# Patient Record
Sex: Male | Born: 1990 | Race: Black or African American | Hispanic: No | Marital: Single | Smoking: Current every day smoker
Health system: Southern US, Community
[De-identification: ages and names within clinical notes are randomized; demographics above are authoritative.]

---
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
## 2009-01-05 ENCOUNTER — Emergency Department (HOSPITAL_COMMUNITY): Admission: EM | Admit: 2009-01-05 | Discharge: 2009-01-05 | Payer: Self-pay | Admitting: Emergency Medicine

## 2009-02-16 ENCOUNTER — Emergency Department (HOSPITAL_COMMUNITY): Admission: EM | Admit: 2009-02-16 | Discharge: 2009-02-16 | Payer: Self-pay | Admitting: Emergency Medicine

## 2009-12-04 ENCOUNTER — Emergency Department (HOSPITAL_COMMUNITY): Admission: EM | Admit: 2009-12-04 | Discharge: 2009-12-04 | Payer: Self-pay | Admitting: Emergency Medicine

## 2010-06-26 LAB — POCT URINALYSIS DIPSTICK
Hgb urine dipstick: NEGATIVE
Nitrite: NEGATIVE
Urobilinogen, UA: 1 mg/dL (ref 0.0–1.0)
pH: 8.5 — ABNORMAL HIGH (ref 5.0–8.0)

## 2010-11-04 ENCOUNTER — Emergency Department (HOSPITAL_COMMUNITY): Payer: Self-pay

## 2010-11-04 ENCOUNTER — Emergency Department (HOSPITAL_COMMUNITY)
Admission: EM | Admit: 2010-11-04 | Discharge: 2010-11-04 | Disposition: A | Discharge status: Home / Self Care | Payer: No Typology Code available for payment source | Attending: Emergency Medicine | Admitting: Emergency Medicine

## 2010-11-04 DIAGNOSIS — M542 Cervicalgia: Secondary | ICD-10-CM | POA: Insufficient documentation

## 2010-11-04 DIAGNOSIS — S139XXA Sprain of joints and ligaments of unspecified parts of neck, initial encounter: Secondary | ICD-10-CM | POA: Insufficient documentation

## 2012-03-20 ENCOUNTER — Other Ambulatory Visit (HOSPITAL_COMMUNITY)
Admission: RE | Admit: 2012-03-20 | Discharge: 2012-03-20 | Disposition: A | Discharge status: Home / Self Care | Payer: 59 | Source: Ambulatory Visit | Attending: Emergency Medicine | Admitting: Emergency Medicine

## 2012-03-20 ENCOUNTER — Emergency Department (INDEPENDENT_AMBULATORY_CARE_PROVIDER_SITE_OTHER)
Admission: EM | Admit: 2012-03-20 | Discharge: 2012-03-20 | Disposition: A | Discharge status: Home / Self Care | Payer: 59 | Source: Home / Self Care | Attending: Emergency Medicine | Admitting: Emergency Medicine

## 2012-03-20 ENCOUNTER — Encounter (HOSPITAL_COMMUNITY): Payer: Self-pay | Admitting: Emergency Medicine

## 2012-03-20 DIAGNOSIS — Z113 Encounter for screening for infections with a predominantly sexual mode of transmission: Secondary | ICD-10-CM | POA: Insufficient documentation

## 2012-03-20 DIAGNOSIS — Z202 Contact with and (suspected) exposure to infections with a predominantly sexual mode of transmission: Secondary | ICD-10-CM

## 2012-03-20 MED ORDER — AZITHROMYCIN 250 MG PO TABS
1000.0000 mg | ORAL_TABLET | Freq: Once | ORAL | Status: AC
  Administered 2012-03-20: 1000 mg via ORAL

## 2012-03-20 MED ORDER — AZITHROMYCIN 250 MG PO TABS
ORAL_TABLET | ORAL | Status: AC
  Filled 2012-03-20: qty 4

## 2012-03-20 NOTE — ED Provider Notes (Signed)
History     CSN: [id number]  Arrival date & time 03/20/12  4098   First MD Initiated Contact with Patient 03/20/12 1010      Chief Complaint  Patient presents with  . Exposure to STD    (Consider location/radiation/quality/duration/timing/severity/associated sxs/prior treatment) HPI Comments: Patient presents urgent care describing that his sexual partner has been diagnosed as having Chlamydia during a pregnancy screening test, he also suspects she was diagnosed with herpes but he's not sure. He comes in today to be treated for Chlamydia and denies any symptoms such as dysuria, urethral discharge, genital rashes or any new symptoms.  Patient is a 21 y.o. male presenting with STD exposure. The history is provided by the patient.  Exposure to STD This is a new problem. The problem has not changed since onset.Pertinent negatives include no abdominal pain. Nothing aggravates the symptoms. He has tried nothing for the symptoms. The treatment provided no relief.    History reviewed. No pertinent past medical history.  History reviewed. No pertinent past surgical history.  No family history on file.  History  Substance Use Topics  . Smoking status: Current Every Day Smoker -- 0.5 packs/day    Types: Cigarettes  . Smokeless tobacco: Not on file  . Alcohol Use: Yes      Review of Systems  Constitutional: Negative for activity change and appetite change.  Gastrointestinal: Negative for abdominal pain.  Genitourinary: Negative for dysuria, urgency, frequency, flank pain, decreased urine volume, discharge, scrotal swelling, enuresis, genital sores, penile pain and testicular pain.  Skin: Negative for rash.    Allergies  Review of patient's allergies indicates no known allergies.  Home Medications  No current outpatient prescriptions on file.  BP 124/74  Pulse 63  Temp 99.2 F (37.3 C) (Oral)  Resp 18  SpO2 99%  Physical Exam  Nursing note and vitals  reviewed. Constitutional: Vital signs are normal. He appears well-developed and well-nourished.  Neck: Neck supple.  Cardiovascular: Normal rate.   No murmur heard. Abdominal: Soft. He exhibits no distension and no mass. There is no tenderness. There is no rebound and no guarding.  Genitourinary: Testes normal and penis normal. No penile erythema. No discharge found.  Neurological: He is alert.  Skin: No rash noted. No erythema.    ED Course  Procedures (including critical care time)   Labs Reviewed  URINE CYTOLOGY ANCILLARY ONLY   No results found.   1. Chlamydia contact, untreated       MDM  Chlamydia exposure. Patient treated today with azithromycin. Sample obtained for echogenic tests for Chlamydia and gonorrhea and trichomonas. Patient was alert about symptoms to be watchful for potential treatment for genital herpes. As patient denies any symptoms currently        Jimmie Molly, MD 03/20/12 1038

## 2012-03-20 NOTE — ED Notes (Signed)
Pt is her for STD check... Exposed by girlfriend... He is asymptomatic... Denies: penile discharge, fevers, vomiting, nauseas, diarrhea... He is alert w/no signs of acute distress.

## 2012-03-24 NOTE — ED Notes (Signed)
Labs reviewed.  GC neg., Chlamydia pos. Pt. adequately treated with Zithromax.  Pt. needs notified and DHHS form needs to be sent. Vassie Moselle 03/24/2012

## 2012-03-24 NOTE — ED Notes (Signed)
Called patient , and after verification of ID, discussed positive chlamydia findings. Treatment while in clinic adequate; form faxed to [facility] records

## 2015-11-03 ENCOUNTER — Emergency Department (HOSPITAL_COMMUNITY)
Admission: EM | Admit: 2015-11-03 | Discharge: 2015-11-03 | Disposition: A | Discharge status: Home / Self Care | Payer: Medicaid Other | Attending: Emergency Medicine | Admitting: Emergency Medicine

## 2015-11-03 ENCOUNTER — Encounter (HOSPITAL_COMMUNITY): Payer: Self-pay | Admitting: Emergency Medicine

## 2015-11-03 DIAGNOSIS — F17210 Nicotine dependence, cigarettes, uncomplicated: Secondary | ICD-10-CM | POA: Insufficient documentation

## 2015-11-03 DIAGNOSIS — K0889 Other specified disorders of teeth and supporting structures: Secondary | ICD-10-CM | POA: Insufficient documentation

## 2015-11-03 MED ORDER — OXYCODONE-ACETAMINOPHEN 5-325 MG PO TABS
1.0000 | ORAL_TABLET | ORAL | Status: DC | PRN
Start: 2015-11-03 — End: 2015-11-03
  Administered 2015-11-03: 1 via ORAL
  Filled 2015-11-03: qty 1

## 2015-11-03 MED ORDER — PENICILLIN V POTASSIUM 500 MG PO TABS: 500.0000 mg | ORAL_TABLET | Freq: Four times a day (QID) | ORAL | 0 refills | Status: AC

## 2015-11-03 MED ORDER — IBUPROFEN 800 MG PO TABS: 800.0000 mg | ORAL_TABLET | Freq: Three times a day (TID) | ORAL | 0 refills | Status: DC | PRN

## 2015-11-03 NOTE — ED Provider Notes (Signed)
[facility] Provider Note   CSN: [id number] Arrival date & time: 11/03/15  4098  First Provider Contact:  First MD Initiated Contact with Patient 11/03/15 [phone]        History   Chief Complaint Chief Complaint  Patient presents with  . Dental Pain    HPI Keith Oneal is a 25 y.o. male.  HPI    Patient presents with dental pain for several months, worse over the past 3 days.  States the pain is on the left side, top and bottom molars.  Has a cracked tooth on top and a hole in the tooth on the bottom.  The pain is sharp and radiates towards his ear.  Has tried orajel and tylenol without relief, also tried 1 leftover antibiotic pill.  Denies fevers, facial swelling, sore throat, difficulty swallowing or breathing.     History reviewed. No pertinent past medical history.  There are no active problems to display for this patient.   History reviewed. No pertinent surgical history.  OB History    No data available       Home Medications    Prior to Admission medications   Not on File    Family History No family history on file.  Social History Social History  Substance Use Topics  . Smoking status: Current Every Day Smoker    Packs/day: 0.00    Types: Cigarettes  . Smokeless tobacco: Not on file  . Alcohol use Yes     Allergies   Review of patient's allergies indicates no known allergies.   Review of Systems Review of Systems  Constitutional: Negative for activity change, appetite change, chills and fever.  HENT: Positive for dental problem. Negative for drooling, facial swelling, sinus pressure, sore throat and trouble swallowing.   Respiratory: Negative for shortness of breath.   Musculoskeletal: Negative for myalgias, neck pain and neck stiffness.  Skin: Negative for color change and rash.  Allergic/Immunologic: Negative for immunocompromised state.  Psychiatric/Behavioral: Negative for self-injury.     Physical Exam Updated Vital  Signs BP 164/64 (BP Location: Left Arm)   Pulse 70   Temp 98.6 F (37 C)   Resp 16   Ht  (1.803 m)   Wt 70.3 kg   SpO2 99%   BMI 21.62 kg/m   Physical Exam  Constitutional: He appears well-developed and well-nourished. No distress.  HENT:  Head: Normocephalic and atraumatic.  Mouth/Throat: Uvula is midline and oropharynx is clear and moist. Mucous membranes are not dry. No uvula swelling. No oropharyngeal exudate, posterior oropharyngeal edema, posterior oropharyngeal erythema or tonsillar abscesses.  Left lower third molar tender to percussion.  Left upper molar with remote fracture, nontender.  No facial swelling.   Neck: Normal range of motion. Neck supple.  Cardiovascular: Normal rate.   Pulmonary/Chest: Effort normal and breath sounds normal. No stridor.  Lymphadenopathy:    He has no cervical adenopathy.  Neurological: He is alert.  Skin: He is not diaphoretic.  Nursing note and vitals reviewed.    ED Treatments / Results  Labs (all labs ordered are listed, but only abnormal results are displayed) Labs Reviewed - No data to display  EKG  EKG Interpretation None       Radiology No results found.  Procedures Procedures (including critical care time)  Medications Ordered in ED Medications - No data to display   Initial Impression / Assessment and Plan / ED Course  I have reviewed the triage vital signs and the nursing  notes.  Pertinent labs & imaging results that were available during my care of the patient were reviewed by me and considered in my medical decision making (see chart for details).  Clinical Course    Afebrile, nontoxic patient with new dental pain.  No obvious abscess.  No concerning findings on exam.  Doubt deep space head or neck infection.  Doubt Ludwig's angina.  D/C home with antibiotic, pain medication and dental follow up.  Discussed findings, treatment, and follow up  with patient.  Pt given return precautions.  Pt verbalizes  understanding and agrees with plan.      Final Clinical Impressions(s) / ED Diagnoses   Final diagnoses:  Pain, dental    New Prescriptions Discharge Medication List as of 11/03/2015  6:44 AM    START taking these medications   Details  ibuprofen (ADVIL,MOTRIN) 800 MG tablet Take 1 tablet (800 mg total) by mouth every 8 (eight) hours as needed for mild pain or moderate pain., Starting Mon 11/03/2015, Print    penicillin v potassium (VEETID) 500 MG tablet Take 1 tablet (500 mg total) by mouth 4 (four) times daily. X 7 days, Starting Mon 11/03/2015, Until Mon 11/10/2015, Print         [place], [place] 11/03/15 1249    Shon Baton, MD 11/03/15 2302

## 2015-11-03 NOTE — Discharge Instructions (Signed)
Read the information below.  Use the prescribed medication as directed.  Please discuss all new medications with your pharmacist.  You may return to the Emergency Department at any time for worsening condition or any new symptoms that concern you.   Please call the dentist listed above within 48 hours to schedule a close follow up appointment.  If you develop fevers, swelling in your face, difficulty swallowing or breathing, return to the ER immediately for a recheck.   °

## 2015-11-03 NOTE — ED Triage Notes (Signed)
Patient reports worsening left upper /lower molar pain for several days .

## 2018-08-23 ENCOUNTER — Encounter (HOSPITAL_COMMUNITY): Payer: Self-pay | Admitting: Emergency Medicine

## 2018-08-23 ENCOUNTER — Ambulatory Visit (HOSPITAL_COMMUNITY)
Admission: EM | Admit: 2018-08-23 | Discharge: 2018-08-23 | Disposition: A | Discharge status: Home / Self Care | Payer: Self-pay | Attending: Family Medicine | Admitting: Family Medicine

## 2018-08-23 ENCOUNTER — Other Ambulatory Visit: Payer: Self-pay

## 2018-08-23 DIAGNOSIS — Z202 Contact with and (suspected) exposure to infections with a predominantly sexual mode of transmission: Secondary | ICD-10-CM

## 2018-08-23 DIAGNOSIS — R590 Localized enlarged lymph nodes: Secondary | ICD-10-CM

## 2018-08-23 MED ORDER — CEFTRIAXONE SODIUM 250 MG IJ SOLR
INTRAMUSCULAR | Status: AC
  Filled 2018-08-23: qty 250

## 2018-08-23 MED ORDER — AZITHROMYCIN 250 MG PO TABS
1000.0000 mg | ORAL_TABLET | Freq: Once | ORAL | Status: AC
  Administered 2018-08-23: 14:00:00 1000 mg via ORAL

## 2018-08-23 MED ORDER — AZITHROMYCIN 250 MG PO TABS
ORAL_TABLET | ORAL | Status: AC
  Filled 2018-08-23: qty 4

## 2018-08-23 MED ORDER — CEFTRIAXONE SODIUM 250 MG IJ SOLR
250.0000 mg | Freq: Once | INTRAMUSCULAR | Status: AC
  Administered 2018-08-23: 14:00:00 250 mg via INTRAMUSCULAR

## 2018-08-23 NOTE — Discharge Instructions (Signed)
You have been given the following medications today for treatment of chlamydia:  cefTRIAXone (ROCEPHIN) injection 250 mg azithromycin (ZITHROMAX) tablet 1,000 mg  Please refrain from all sexual activity for at least the next seven days.

## 2018-08-23 NOTE — ED Triage Notes (Signed)
Pt here for right sided neck lymph nodes swelling x 2 days

## 2018-08-23 NOTE — ED Provider Notes (Signed)
[facility]   [id number] 08/23/18 Arrival Time: 1316  ASSESSMENT & PLAN:  1. STD exposure   2. Lymphadenopathy, cervical    Observation of R lymphadenopathy. If not resolving over the next week, recommend serial evaluations. Discussed.   Discharge Instructions     You have Oneal given the following medications today for treatment of chlamydia:  cefTRIAXone (ROCEPHIN) injection 250 mg azithromycin (ZITHROMAX) tablet 1,000 mg  Please refrain from all sexual activity for at least the next seven days.    Declines urine cytology/HIV/RPR.  Reviewed expectations re: course of current medical issues. Questions answered. Outlined signs and symptoms indicating need for more acute intervention. Patient verbalized understanding. After Visit Summary given.   SUBJECTIVE:  Keith Oneal is a 28 y.o. male who reports possible chlamydia exposure. Sexual partner told him of + chlamydia test recently. He reports no penile discharge or rashes. Does report noticing a "painful lump" of R lower neck; noticed yesterday. No fever reported. Urinary symptoms: none. Afebrile. No abdominal or pelvic pain. No n/v. No rashes or lesions. Sexually active with multiple male partners without regular condom use; oral and penile-vaginal sex.  ROS: As per HPI.  OBJECTIVE:  Vitals:   08/23/18 1337  BP: 130/89  Pulse: 89  Resp: 18  Temp: 99 F (37.2 C)  TempSrc: Oral  SpO2: 95%    General appearance: alert, cooperative, appears stated age and no distress Scalp: no skin lesions/wounds/signs of infection Throat: lips, mucosa, and tongue normal; teeth and gums normal Neck: R solitary enlarged cervical lymph node; no fluctuance; tender to touch; approx 1 cm CV: RRR Lungs: CTAB Back: no CVA tenderness; FROM at waist Abdomen: soft, non-tender GU: deferred Skin: warm and dry Psychological: alert and cooperative; normal mood and affect.   No Known Allergies  PMH: Treated chlamydia.   Social History   Socioeconomic History  . Marital status: Married    Spouse name: Not on file  . Number of children: Not on file  . Years of education: Not on file  . Highest education level: Not on file  Occupational History  . Not on file  Social Needs  . Financial resource strain: Not on file  . Food insecurity:    Worry: Not on file    Inability: Not on file  . Transportation needs:    Medical: Not on file    Non-medical: Not on file  Tobacco Use  . Smoking status: Current Every Day Smoker    Packs/day: 0.00    Types: Cigarettes  Substance and Sexual Activity  . Alcohol use: Yes  . Drug use: No  . Sexual activity: Not on file  Lifestyle  . Physical activity:    Days per week: Not on file    Minutes per session: Not on file  . Stress: Not on file  Relationships  . Social connections:    Talks on phone: Not on file    Gets together: Not on file    Attends religious service: Not on file    Active member of club or organization: Not on file    Attends meetings of clubs or organizations: Not on file    Relationship status: Not on file  . Intimate partner violence:    Fear of current or ex partner: Not on file    Emotionally abused: Not on file    Physically abused: Not on file    Forced sexual activity: Not on file  Other Topics Concern  . Not on file  Social History Narrative  . Not on file          Mardella Layman, MD 08/24/18 [phone]

## 2018-09-16 ENCOUNTER — Other Ambulatory Visit: Payer: Self-pay

## 2018-09-16 ENCOUNTER — Emergency Department (HOSPITAL_COMMUNITY): Payer: Self-pay

## 2018-09-16 ENCOUNTER — Emergency Department (HOSPITAL_COMMUNITY)
Admission: EM | Admit: 2018-09-16 | Discharge: 2018-09-16 | Disposition: A | Discharge status: Home / Self Care | Payer: Self-pay | Attending: Emergency Medicine | Admitting: Emergency Medicine

## 2018-09-16 ENCOUNTER — Encounter (HOSPITAL_COMMUNITY): Payer: Self-pay | Admitting: Emergency Medicine

## 2018-09-16 DIAGNOSIS — R112 Nausea with vomiting, unspecified: Secondary | ICD-10-CM | POA: Insufficient documentation

## 2018-09-16 DIAGNOSIS — R1030 Lower abdominal pain, unspecified: Secondary | ICD-10-CM | POA: Insufficient documentation

## 2018-09-16 DIAGNOSIS — R3129 Other microscopic hematuria: Secondary | ICD-10-CM | POA: Insufficient documentation

## 2018-09-16 DIAGNOSIS — F17210 Nicotine dependence, cigarettes, uncomplicated: Secondary | ICD-10-CM | POA: Insufficient documentation

## 2018-09-16 DIAGNOSIS — R61 Generalized hyperhidrosis: Secondary | ICD-10-CM | POA: Insufficient documentation

## 2018-09-16 DIAGNOSIS — D72829 Elevated white blood cell count, unspecified: Secondary | ICD-10-CM | POA: Insufficient documentation

## 2018-09-16 LAB — RAPID URINE DRUG SCREEN, HOSP PERFORMED
Amphetamines: NOT DETECTED
Barbiturates: NOT DETECTED
Benzodiazepines: NOT DETECTED
Cocaine: NOT DETECTED
Opiates: POSITIVE — AB
Tetrahydrocannabinol: POSITIVE — AB

## 2018-09-16 LAB — CBC WITH DIFFERENTIAL/PLATELET
Abs Immature Granulocytes: 0.05 10*3/uL (ref 0.00–0.07)
Basophils Absolute: 0.1 10*3/uL (ref 0.0–0.1)
Basophils Relative: 0 %
Eosinophils Absolute: 0.1 10*3/uL (ref 0.0–0.5)
Eosinophils Relative: 1 %
HCT: 43.8 % (ref 39.0–52.0)
Hemoglobin: 15.1 g/dL (ref 13.0–17.0)
Immature Granulocytes: 0 %
Lymphocytes Relative: 24 %
Lymphs Abs: 3.9 10*3/uL (ref 0.7–4.0)
MCH: 32.3 pg (ref 26.0–34.0)
MCHC: 34.5 g/dL (ref 30.0–36.0)
MCV: 93.6 fL (ref 80.0–100.0)
Monocytes Absolute: 0.9 10*3/uL (ref 0.1–1.0)
Monocytes Relative: 5 %
Neutro Abs: 11.4 10*3/uL — ABNORMAL HIGH (ref 1.7–7.7)
Neutrophils Relative %: 70 %
Platelets: 247 10*3/uL (ref 150–400)
RBC: 4.68 MIL/uL (ref 4.22–5.81)
RDW: 12.2 % (ref 11.5–15.5)
WBC: 16.4 10*3/uL — ABNORMAL HIGH (ref 4.0–10.5)
nRBC: 0 % (ref 0.0–0.2)

## 2018-09-16 LAB — COMPREHENSIVE METABOLIC PANEL
ALT: 24 U/L (ref 0–44)
AST: 31 U/L (ref 15–41)
Albumin: 4.4 g/dL (ref 3.5–5.0)
Alkaline Phosphatase: 78 U/L (ref 38–126)
Anion gap: 9 (ref 5–15)
BUN: 12 mg/dL (ref 6–20)
CO2: 25 mmol/L (ref 22–32)
Calcium: 9.2 mg/dL (ref 8.9–10.3)
Chloride: 103 mmol/L (ref 98–111)
Creatinine, Ser: 1.26 mg/dL — ABNORMAL HIGH (ref 0.61–1.24)
GFR calc Af Amer: >60 mL/min (ref >60)
GFR calc non Af Amer: >60 mL/min (ref >60)
Glucose, Bld: 119 mg/dL — ABNORMAL HIGH (ref 70–99)
Potassium: 3.8 mmol/L (ref 3.5–5.1)
Sodium: 137 mmol/L (ref 135–145)
Total Bilirubin: 1.1 mg/dL (ref 0.3–1.2)
Total Protein: 7.6 g/dL (ref 6.5–8.1)

## 2018-09-16 LAB — URINALYSIS, ROUTINE W REFLEX MICROSCOPIC
Bilirubin Urine: NEGATIVE
Glucose, UA: NEGATIVE mg/dL
Ketones, ur: NEGATIVE mg/dL
Leukocytes,Ua: NEGATIVE
Nitrite: NEGATIVE
Protein, ur: NEGATIVE mg/dL
RBC / HPF: >50 RBC/hpf — ABNORMAL HIGH (ref 0–5)
Specific Gravity, Urine: 1.018 (ref 1.005–1.030)
pH: 7 (ref 5.0–8.0)

## 2018-09-16 LAB — ETHANOL: Alcohol, Ethyl (B): <10 mg/dL (ref <10)

## 2018-09-16 LAB — LIPASE, BLOOD: Lipase: 32 U/L (ref 11–51)

## 2018-09-16 MED ORDER — ONDANSETRON HCL 4 MG/2ML IJ SOLN
4.0000 mg | Freq: Once | INTRAMUSCULAR | Status: AC
  Administered 2018-09-16: 4 mg via INTRAVENOUS
  Filled 2018-09-16: qty 2

## 2018-09-16 MED ORDER — DICYCLOMINE HCL 10 MG/ML IM SOLN: 20.0000 mg | Freq: Once | INTRAMUSCULAR | Status: DC

## 2018-09-16 MED ORDER — IOHEXOL 300 MG/ML  SOLN
100.0000 mL | Freq: Once | INTRAMUSCULAR | Status: AC | PRN
  Administered 2018-09-16: 100 mL via INTRAVENOUS

## 2018-09-16 MED ORDER — HYDROMORPHONE HCL 1 MG/ML IJ SOLN
1.0000 mg | Freq: Once | INTRAMUSCULAR | Status: AC
  Administered 2018-09-16: 1 mg via INTRAVENOUS
  Filled 2018-09-16: qty 1

## 2018-09-16 MED ORDER — SODIUM CHLORIDE 0.9 % IV BOLUS (SEPSIS)
1000.0000 mL | Freq: Once | INTRAVENOUS | Status: AC
  Administered 2018-09-16: 1000 mL via INTRAVENOUS

## 2018-09-16 MED ORDER — ONDANSETRON 4 MG PO TBDP: 4.0000 mg | ORAL_TABLET | Freq: Four times a day (QID) | ORAL | 0 refills | Status: DC | PRN

## 2018-09-16 MED ORDER — SODIUM CHLORIDE (PF) 0.9 % IJ SOLN
INTRAMUSCULAR | Status: AC
  Filled 2018-09-16: qty 50

## 2018-09-16 MED ORDER — SODIUM CHLORIDE 0.9 % IV SOLN
1.0000 g | Freq: Once | INTRAVENOUS | Status: AC
  Administered 2018-09-16: 07:00:00 1 g via INTRAVENOUS
  Filled 2018-09-16: qty 10

## 2018-09-16 MED ORDER — CEPHALEXIN 500 MG PO CAPS: 500.0000 mg | ORAL_CAPSULE | Freq: Three times a day (TID) | ORAL | 0 refills | Status: AC

## 2018-09-16 MED ORDER — DICYCLOMINE HCL 20 MG PO TABS: 20.0000 mg | ORAL_TABLET | Freq: Three times a day (TID) | ORAL | 0 refills | Status: AC | PRN

## 2018-09-16 NOTE — ED Triage Notes (Signed)
Patient BIB [facility] from [facility] for abdominal pain that woke him from sleep about 2 hours ago. Pt admits to using drugs and alcohol tonight. C/o nausea and vomiting, pain 10/10.

## 2018-09-16 NOTE — Discharge Instructions (Signed)
Steps to find a Primary Care Provider (PCP): ° °Call [phone] or [phone] to access "[facility] Find a Doctor Service." ° °2.  You may also go on the [facility] website at [email or web address] ° °3.  [facility] also frequently accepts new patients. ° °[facility]  °[address] °[place] [address] °[phone] ° °4.  There are also multiple [facility], [facility], [facility] and [facility] practices throughout the [place] that are frequently accepting new patients. You may find a clinic that is close to your home and contact them. ° °[facility] °[email or web address] °[phone] ° °[facility] °[email or web address] ° °[facility] °[email or web address] °[phone] ° °[facility] °[email or web address] °[phone] ° °5.  Local Health Departments also can provide primary care services. ° °[facility]  °[address] °[place] [address] °[phone] ° °[facility] °[address] °[place] [address] °[phone] ° °[facility] °[address]  °[place] [address] °[phone] ° ° °

## 2018-09-16 NOTE — ED Notes (Signed)
Po challenge of water provided for pt. He is tolerating well at present.

## 2018-09-16 NOTE — ED Notes (Signed)
Bed: QA44 Expected date:  Expected time:  Means of arrival:  Comments: 66M abd pain

## 2018-09-16 NOTE — ED Notes (Signed)
Patient is aware urine sample is needed, but states he is unable to at this time.

## 2018-09-16 NOTE — ED Provider Notes (Addendum)
TIME SEEN: 3:53 AM  CHIEF COMPLAINT: Abdominal pain, vomiting  HPI: Patient is a 28 year old male with no significant past medical history who presents to the emergency department with abdominal pain that started about 2 hours ago that is severe, sharp throughout the lower abdomen worse in the right lower quadrant with nausea and vomiting.  Has never had similar symptoms.  Denies any fevers, chills.  He is diaphoretic here.  Denies diarrhea.  No dysuria, hematuria, testicular pain or swelling, penile discharge.  No previous history of abdominal surgery.  No history of kidney stones.  No sick contacts.  No chest pain, shortness of breath, cough.  ROS: See HPI Constitutional: no fever  Eyes: no drainage  ENT: no runny nose   Cardiovascular:  no chest pain  Resp: no SOB  GI:  vomiting GU: no dysuria Integumentary: no rash  Allergy: no hives  Musculoskeletal: no leg swelling  Neurological: no slurred speech ROS otherwise negative  PAST MEDICAL HISTORY/PAST SURGICAL HISTORY:  None  MEDICATIONS:  Prior to Admission medications   Medication Sig Start Date End Date Taking? Authorizing Provider  ibuprofen (ADVIL,MOTRIN) 800 MG tablet Take 1 tablet (800 mg total) by mouth every 8 (eight) hours as needed for mild pain or moderate pain. 11/03/15   Trixie DredgeWest, Emily, PA-C    ALLERGIES:  No Known Allergies  SOCIAL HISTORY:  Social History   Tobacco Use  . Smoking status: Current Every Day Smoker    Packs/day: 0.00    Types: Cigarettes  Substance Use Topics  . Alcohol use: Yes    FAMILY HISTORY: No family history on file.  EXAM: BP (!) 142/102 (BP Location: Left Arm)   Pulse 74   Temp 97.7 F (36.5 C) (Oral)   Resp 16   Ht 5\' 9"  (1.753 m)   Wt 77.1 kg   SpO2 97%   BMI 25.10 kg/m  CONSTITUTIONAL: Alert and oriented and responds appropriately to questions.  Patient appears very uncomfortable, diaphoretic, afebrile HEAD: Normocephalic EYES: Conjunctivae clear, pupils appear equal,  EOMI ENT: normal nose; moist mucous membranes NECK: Supple, no meningismus, no nuchal rigidity, no LAD  CARD: RRR; S1 and S2 appreciated; no murmurs, no clicks, no rubs, no gallops RESP: Normal chest excursion without splinting or tachypnea; breath sounds clear and equal bilaterally; no wheezes, no rhonchi, no rales, no hypoxia or respiratory distress, speaking full sentences ABD/GI: Normal bowel sounds; non-distended; soft, tender throughout the lower abdomen with guarding at McBurney's point, no rebound tenderness GU:  Normal external genitalia, circumcised male, normal penile shaft, no blood or discharge at the urethral meatus, no testicular masses or tenderness on exam, no scrotal masses or swelling, no hernias appreciated, 2+ femoral pulses bilaterally; no perineal erythema, warmth, subcutaneous air or crepitus; no high riding testicle, normal bilateral cremasteric reflex.  Chaperone present for exam. BACK:  The back appears normal and is non-tender to palpation, there is no CVA tenderness EXT: Normal ROM in all joints; non-tender to palpation; no edema; normal capillary refill; no cyanosis, no calf tenderness or swelling    SKIN: Normal color for age and race; warm; no rash NEURO: Moves all extremities equally PSYCH: The patient's mood and manner are appropriate. Grooming and personal hygiene are appropriate.  MEDICAL DECISION MAKING: Patient here with right lower quadrant abdominal pain.  Differential includes appendicitis, kidney stone, UTI, colitis, diverticulitis, bowel obstruction.  Will obtain labs, urine, CT of the abdomen pelvis.  Will give IV fluids, pain and nausea medicine.  ED PROGRESS: Patient  admitted to drug and alcohol use with nursing staff.  Will obtain ethanol level, UDS.   4:50 AM  Patient appears more comfortable.  He does have a leukocytosis of 16,000 with left shift.  His ethanol level is negative.  He reports he drank "a lot" of alcohol today and took 1-1/2 Percocets.   States he took them by mouth.  Denies any other drug use.  States these are medications that he bought off the street.  He does not have a prescription for Percocet.  6:10 AM  Pt still complaining of lower abdominal pain and has right lower quadrant and left lower quadrant tenderness with voluntary guarding.  A CT scan is pending.  His labs show a leukocytosis.  Mildly elevated creatinine of 1.26 with normal BUN.  Otherwise unremarkable.  Urine has been sent.  Will give second dose of IV Dilaudid here for pain control.   6:29 AM  CT of the abdomen pelvis shows no acute abnormality.  Appendix appears normal.  No inflammation of the bowel or sign of bowel obstruction.  Bladder, kidneys appear normal.  Gallbladder, pancreas appear normal.  Patient denies having any diarrhea.  He denies any genital symptoms and refuses genital exam.  He is comfortable and resting.  Will give IM Bentyl here and then fluid challenge.  6:45 AM  Pt's urine shows a large amount of blood but no other sign of infection.  We will send urine culture and obtain urine gonorrhea and chlamydia screening.  I have talked to the radiologist Dr. Chase PicketHerman who has reviewed his imaging again and states that there are no kidney stones, signs of hydronephrosis or hydroureter and appendix appears normal.  It does appear that the patient was seen at urgent care on May 13 for possible STD exposure as his partner was positive for chlamydia.  He did receive ceftriaxone and azithromycin.  He refused STD testing at that time.  He told that physician that he was sexually active with multiple male partners and did not use condoms.  7:02 AM  Pt now allows genital exam which is unremarkable.  He declines HIV and syphilis testing today.  He states he is feeling much better and able to drink without difficulty.  He states he thinks this could have been a bad food exposure and from drinking alcohol.  He states he was sexually active last night.  He has not had  any penile discharge.  States he has not seen any gross hematuria.  He is not having dysuria, testicular pain or swelling.  Discussed with patient that he does have blood in his urine and has a leukocytosis with left shift which could be from infection versus reactive in nature.  Urine culture will be sent and IV Rocephin ordered today.  Will discharge on Keflex with prescriptions of Zofran and Bentyl.  I do not feel he needs to be discharged with narcotic pain medication given his history of opiate abuse.  Discussed return precautions with patient and his mother by phone.  They verbalized understanding and are appreciative of care.   At this time, I do not feel there is any life-threatening condition present. I have reviewed and discussed all results (EKG, imaging, lab, urine as appropriate) and exam findings with patient/family. I have reviewed nursing notes and appropriate previous records.  I feel the patient is safe to be discharged home without further emergent workup and can continue workup as an outpatient as needed. Discussed usual and customary return precautions. Patient/family verbalize  understanding and are comfortable with this plan.  Outpatient follow-up has been provided as needed. All questions have been answered.        Ward, Delice Bison, DO 09/16/18 [phone]

## 2018-09-17 LAB — URINE CULTURE: Culture: NO GROWTH

## 2019-01-01 ENCOUNTER — Ambulatory Visit (HOSPITAL_COMMUNITY)
Admission: EM | Admit: 2019-01-01 | Discharge: 2019-01-01 | Disposition: A | Discharge status: Home / Self Care | Payer: Self-pay | Attending: Emergency Medicine | Admitting: Emergency Medicine

## 2019-01-01 ENCOUNTER — Encounter (HOSPITAL_COMMUNITY): Payer: Self-pay | Admitting: Emergency Medicine

## 2019-01-01 ENCOUNTER — Other Ambulatory Visit: Payer: Self-pay

## 2019-01-01 DIAGNOSIS — L0291 Cutaneous abscess, unspecified: Secondary | ICD-10-CM

## 2019-01-01 DIAGNOSIS — L02213 Cutaneous abscess of chest wall: Secondary | ICD-10-CM

## 2019-01-01 MED ORDER — IBUPROFEN 800 MG PO TABS: 800.0000 mg | ORAL_TABLET | Freq: Three times a day (TID) | ORAL | 0 refills | Status: AC | PRN

## 2019-01-01 MED ORDER — LIDOCAINE HCL (PF) 1 % IJ SOLN
INTRAMUSCULAR | Status: AC
  Filled 2019-01-01: qty 30

## 2019-01-01 MED ORDER — IBUPROFEN 800 MG PO TABS: 800.0000 mg | ORAL_TABLET | Freq: Three times a day (TID) | ORAL | 0 refills | Status: DC | PRN

## 2019-01-01 MED ORDER — SULFAMETHOXAZOLE-TRIMETHOPRIM 800-160 MG PO TABS: 1.0000 | ORAL_TABLET | Freq: Two times a day (BID) | ORAL | 0 refills | Status: AC

## 2019-01-01 NOTE — Discharge Instructions (Addendum)
Take the antibiotic as prescribed.  You can take the prescribed ibuprofen as directed for pain.    Keep your wound clean and dry.  Wash it gently twice a day with soap and water.  Apply an antibiotic cream and bandage twice a day.    Return here if you see signs of infection, such as increased pain, redness, warmth, fever, chills, or other concerning symptoms.    Your blood pressure is elevated today at 161/96.  Please have this rechecked by your primary care provider in 2 weeks.  If you do not have a primary care provider, one is suggested below.

## 2019-01-01 NOTE — ED Triage Notes (Signed)
Pt here for abscess to right side of chest with some bloody drainage noted; pt sts pain x 4 days

## 2019-01-01 NOTE — ED Provider Notes (Signed)
[facility]    CSN: [id number] Arrival date & time: 01/01/19  1803      History   Chief Complaint Chief Complaint  Patient presents with  . Abscess    HPI Keith Oneal is a 28 y.o. male.   Patient presents with an abscess on his right chest x4 days.  He reports it is draining purulent bloody discharge.  He states it started as an ingrown hair.  He denies fever or chills.  Patient states he is taking antibiotics given to him by his grandmother but does not know the name.  The history is provided by the patient.    History reviewed. No pertinent past medical history.  There are no active problems to display for this patient.   History reviewed. No pertinent surgical history.     Home Medications    Prior to Admission medications   Medication Sig Start Date End Date Taking? Authorizing Provider  cephALEXin (KEFLEX) 500 MG capsule Take 1 capsule (500 mg total) by mouth 3 (three) times daily. Patient not taking: Reported on 01/01/2019 09/16/18   Ward, Layla Maw, DO  dicyclomine (BENTYL) 20 MG tablet Take 1 tablet (20 mg total) by mouth every 8 (eight) hours as needed for spasms (Abdominal cramping). 09/16/18   Ward, Layla Maw, DO  ibuprofen (ADVIL) 800 MG tablet Take 1 tablet (800 mg total) by mouth every 8 (eight) hours as needed for mild pain or moderate pain. 01/01/19   Mickie Bail, NP  ondansetron (ZOFRAN ODT) 4 MG disintegrating tablet Take 1 tablet (4 mg total) by mouth every 6 (six) hours as needed for nausea or vomiting. 09/16/18   Ward, Layla Maw, DO  sulfamethoxazole-trimethoprim (BACTRIM DS) 800-160 MG tablet Take 1 tablet by mouth 2 (two) times daily for 10 days. 01/01/19 01/11/19  Mickie Bail, NP    Family History History reviewed. No pertinent family history.  Social History Social History   Tobacco Use  . Smoking status: Current Every Day Smoker    Packs/day: 0.00    Types: Cigarettes  . Smokeless tobacco: Never Used  Substance Use Topics  .  Alcohol use: Yes  . Drug use: No     Allergies   Patient has no known allergies.   Review of Systems Review of Systems  Constitutional: Negative for chills and fever.  HENT: Negative for ear pain and sore throat.   Eyes: Negative for pain and visual disturbance.  Respiratory: Negative for cough and shortness of breath.   Cardiovascular: Negative for chest pain and palpitations.  Gastrointestinal: Negative for abdominal pain and vomiting.  Genitourinary: Negative for dysuria and hematuria.  Musculoskeletal: Negative for arthralgias and back pain.  Skin: Positive for wound. Negative for color change and rash.  Neurological: Negative for seizures and syncope.  All other systems reviewed and are negative.    Physical Exam Triage Vital Signs ED Triage Vitals  Enc Vitals Group     BP 01/01/19 1909 (!) 161/96     Pulse Rate 01/01/19 1909 82     Resp 01/01/19 1909 16     Temp 01/01/19 1909 98.2 F (36.8 C)     Temp Source 01/01/19 1909 Temporal     SpO2 01/01/19 1909 99 %     Weight --      Height --      Head Circumference --      Peak Flow --      Pain Score 01/01/19 1923 10  Pain Loc --      Pain Edu? --      Excl. in GC? --    No data found.  Updated Vital Signs BP (!) 161/96 (BP Location: Left Arm)   Pulse 82   Temp 98.2 F (36.8 C) (Temporal)   Resp 16   SpO2 99%   Visual Acuity Right Eye Distance:   Left Eye Distance:   Bilateral Distance:    Right Eye Near:   Left Eye Near:    Bilateral Near:     Physical Exam Vitals signs and nursing note reviewed.  Constitutional:      Appearance: He is well-developed.  HENT:     Head: Normocephalic and atraumatic.     Mouth/Throat:     Mouth: Mucous membranes are moist.  Eyes:     Conjunctiva/sclera: Conjunctivae normal.  Neck:     Musculoskeletal: Neck supple.  Cardiovascular:     Rate and Rhythm: Normal rate and regular rhythm.     Heart sounds: No murmur.  Pulmonary:     Effort: Pulmonary effort  is normal. No respiratory distress.     Breath sounds: Normal breath sounds.  Abdominal:     Palpations: Abdomen is soft.     Tenderness: There is no abdominal tenderness. There is no guarding or rebound.  Skin:    General: Skin is warm and dry.     Capillary Refill: Capillary refill takes less than 2 seconds.     Findings: Lesion present.     Comments: 4 cm area of induration with central open lesion.  Scant purulent, bloody drainage.    Neurological:     General: No focal deficit present.     Mental Status: He is alert and oriented to person, place, and time.     Sensory: No sensory deficit.     Motor: No weakness.      UC Treatments / Results  Labs (all labs ordered are listed, but only abnormal results are displayed) Labs Reviewed - No data to display  EKG   Radiology No results found.  Procedures Incision and Drainage  Date/Time: 01/01/2019 7:48 PM Performed by: Mickie Bailate, Michio Thier H, NP Authorized by: Mickie Bailate, Ashantia Amaral H, NP   Consent:    Consent obtained:  Verbal   Consent given by:  Patient Location:    Type:  Abscess   Location:  Trunk   Trunk location:  Chest Pre-procedure details:    Skin preparation:  Hibiclens Procedure details:    Incision depth:  Dermal   Scalpel blade:  11   Drainage amount:  Scant   Wound treatment:  Wound left open Post-procedure details:    Patient tolerance of procedure:  Tolerated well, no immediate complications   (including critical care time)  Medications Ordered in UC Medications  lidocaine (PF) (XYLOCAINE) 1 % injection (has no administration in time range)    Initial Impression / Assessment and Plan / UC Course  I have reviewed the triage vital signs and the nursing notes.  Pertinent labs & imaging results that were available during my care of the patient were reviewed by me and considered in my medical decision making (see chart for details).    Abscess on right chest wall.  Incision and drainage with scant discharge.   Treating with Septra DS and ibuprofen.  Wound care instructions and signs of infection discussed with patient at length.  Discussed with patient that his blood pressure is elevated today needs to be rechecked by his PCP  or the PCP suggested in 2 to 4 weeks.  Patient agrees to plan of care.   Final Clinical Impressions(s) / UC Diagnoses   Final diagnoses:  Abscess     Discharge Instructions     Take the antibiotic as prescribed.  You can take the prescribed ibuprofen as directed for pain.    Keep your wound clean and dry.  Wash it gently twice a day with soap and water.  Apply an antibiotic cream and bandage twice a day.    Return here if you see signs of infection, such as increased pain, redness, warmth, fever, chills, or other concerning symptoms.    Your blood pressure is elevated today at 161/96.  Please have this rechecked by your primary care provider in 2 weeks.  If you do not have a primary care provider, one is suggested below.          ED Prescriptions    Medication Sig Dispense Auth. Provider   ibuprofen (ADVIL) 800 MG tablet  (Status: Discontinued) Take 1 tablet (800 mg total) by mouth every 8 (eight) hours as needed for mild pain or moderate pain. 15 tablet Sharion Balloon, NP   sulfamethoxazole-trimethoprim (BACTRIM DS) 800-160 MG tablet Take 1 tablet by mouth 2 (two) times daily for 10 days. 20 tablet Sharion Balloon, NP   ibuprofen (ADVIL) 800 MG tablet Take 1 tablet (800 mg total) by mouth every 8 (eight) hours as needed for mild pain or moderate pain. 15 tablet Sharion Balloon, NP     PDMP not reviewed this encounter.   Sharion Balloon, NP 01/01/19 [phone]

## 2019-11-27 IMAGING — CT CT ABDOMEN AND PELVIS WITH CONTRAST
2 of 4 series · 16 of 46 positions shown, 18 images · IV contrast (ISOVUE)
Comparison: None.

CLINICAL DATA: Right lower quadrant abdominal pain

EXAM:
CT ABDOMEN AND PELVIS WITH CONTRAST
TECHNIQUE: Multidetector CT imaging of the abdomen and pelvis was performed
using the standard protocol following bolus administration of
intravenous contrast.
CONTRAST:  100mL OMNIPAQUE IOHEXOL 300 MG/ML  SOLN

[Series 2: axial st · axial · 0.71mm/px · z∈[-229,+181]mm · 13 of 92 slices shown, 15 images]
[im 5/92  soft-tissue]
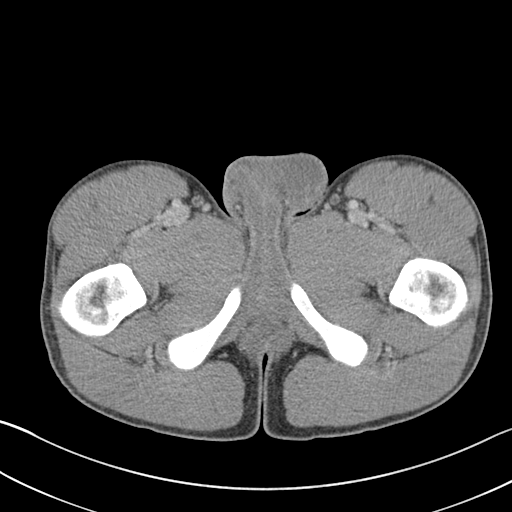
[im 5/92  bone]
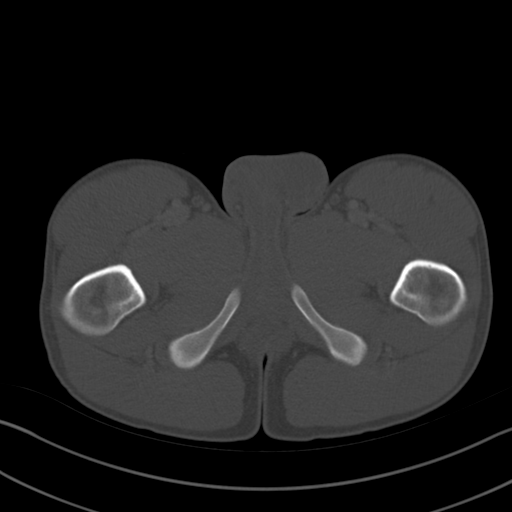
[im 14/92  soft-tissue]
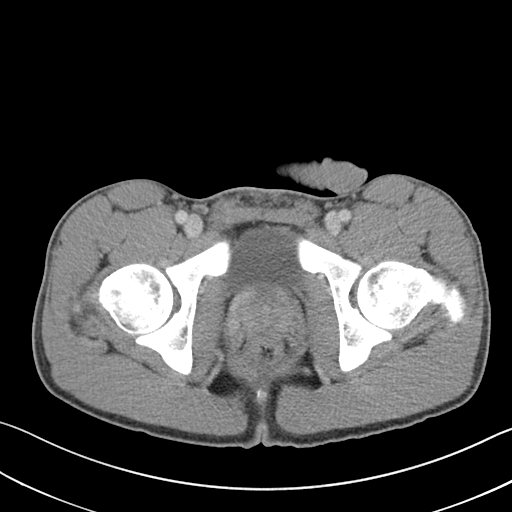
[im 18/92  soft-tissue]
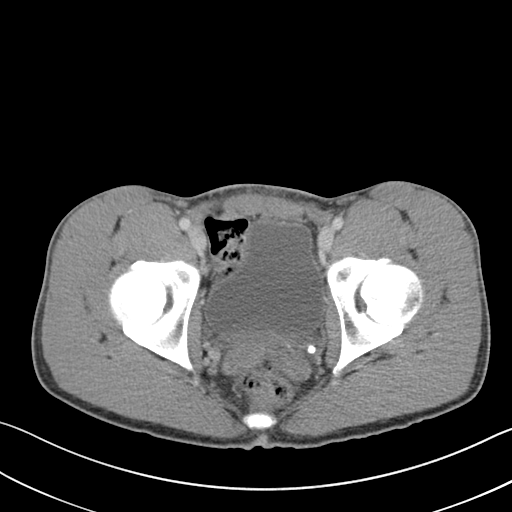
[im 27/92  soft-tissue]
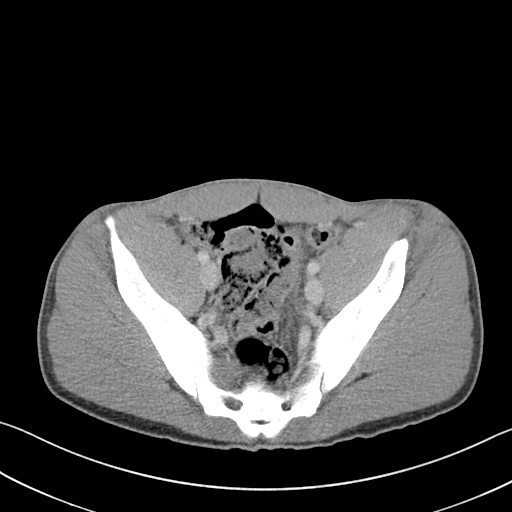
[im 31/92  soft-tissue]
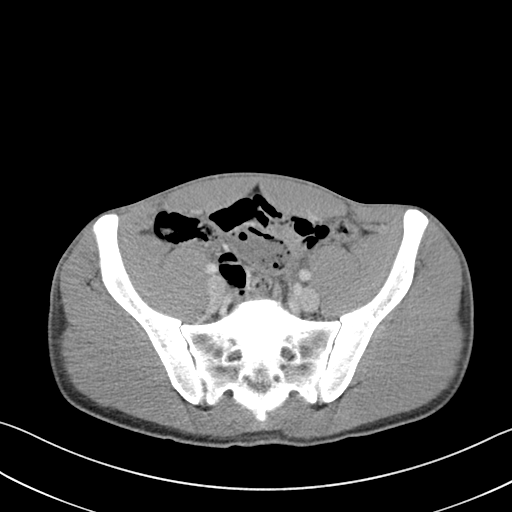
[im 40/92  soft-tissue]
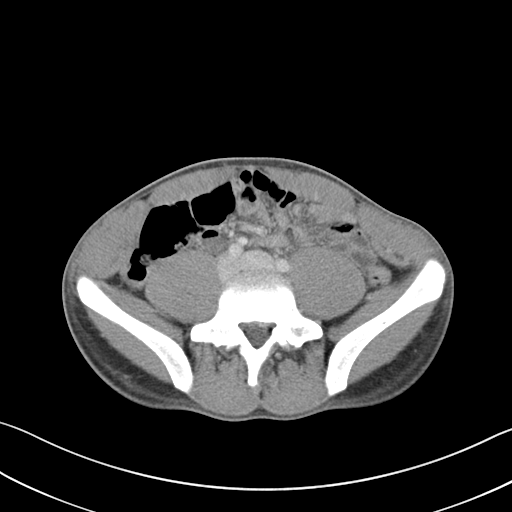
[im 48/92  soft-tissue]
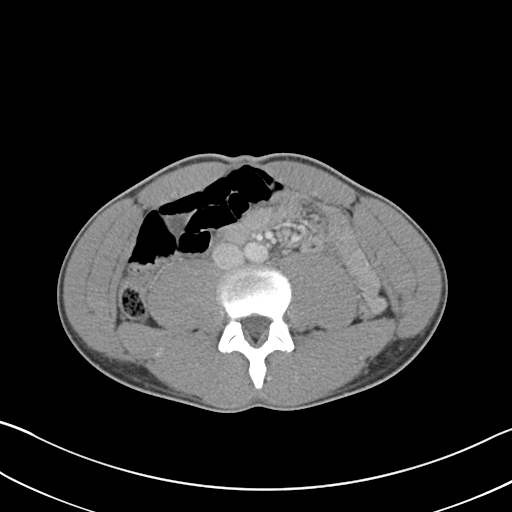
[im 53/92  soft-tissue]
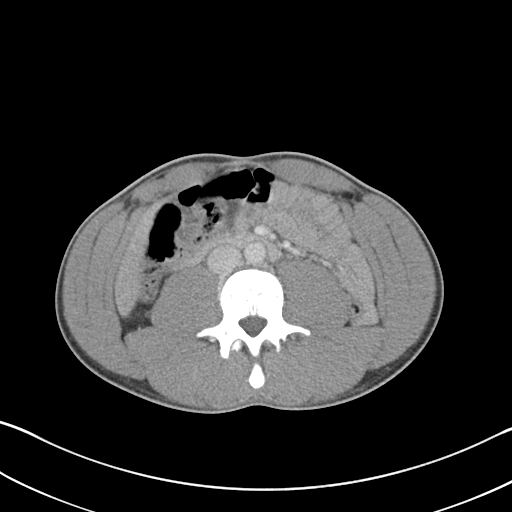
[im 61/92  soft-tissue]
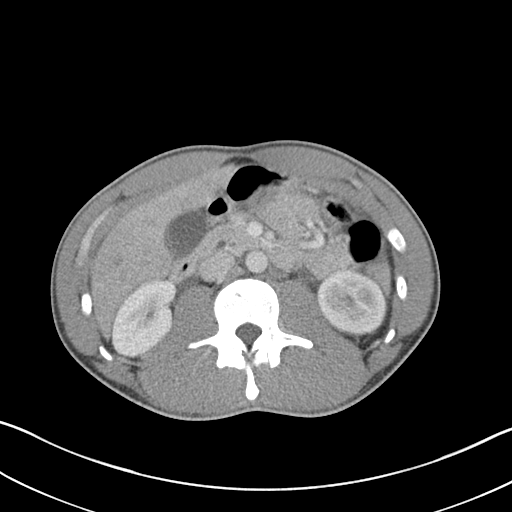
[im 61/92  bone]
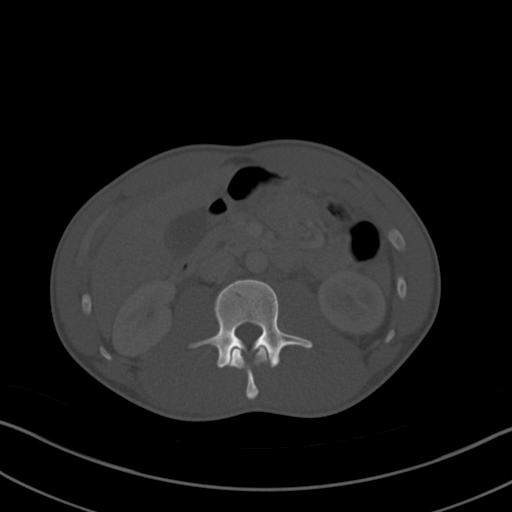
[im 66/92  soft-tissue]
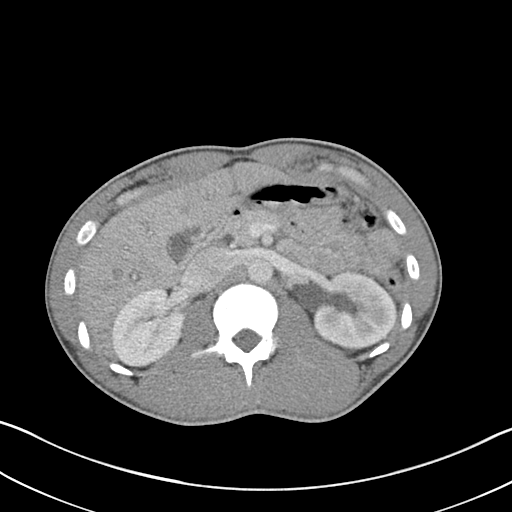
[im 74/92  soft-tissue]
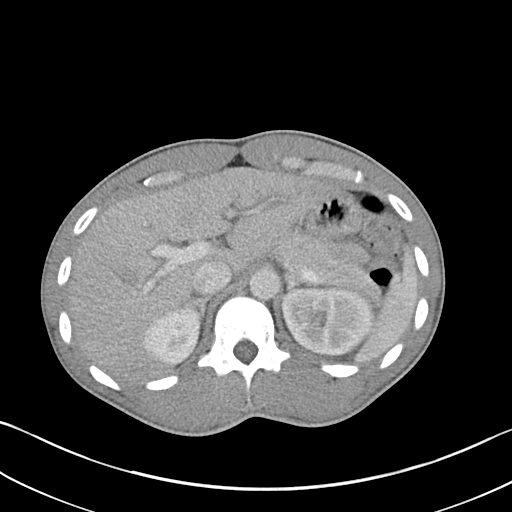
[im 79/92  soft-tissue]
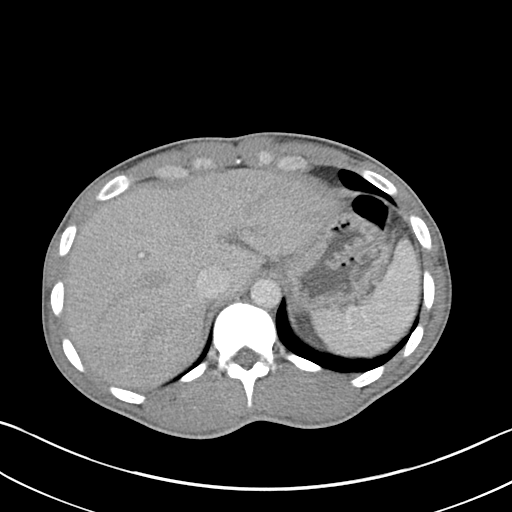
[im 87/92  soft-tissue]
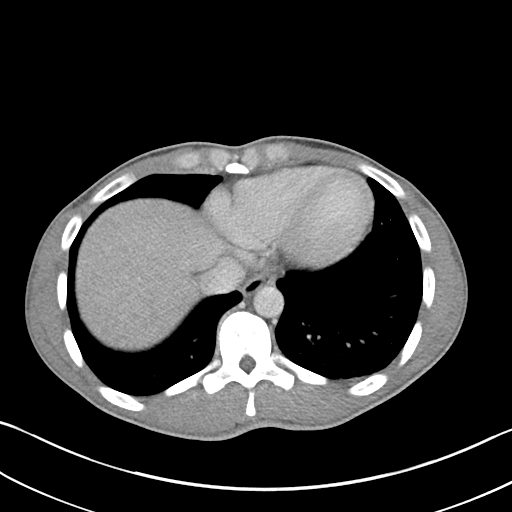

[Series 5: coronal st · coronal · 0.67mm/px · 3 of 119 slices shown]
[im 40/119  soft-tissue]
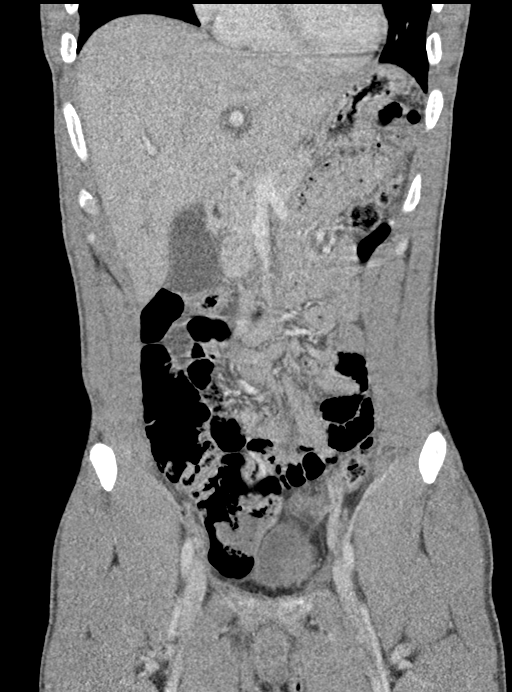
[im 53/119  soft-tissue]
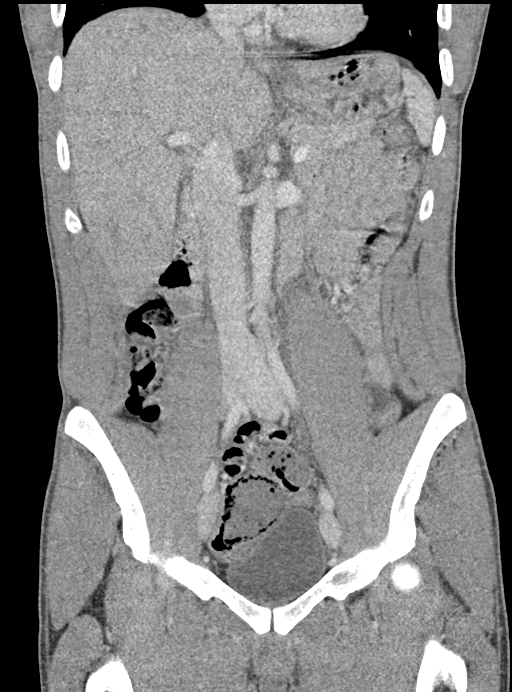
[im 66/119  soft-tissue]
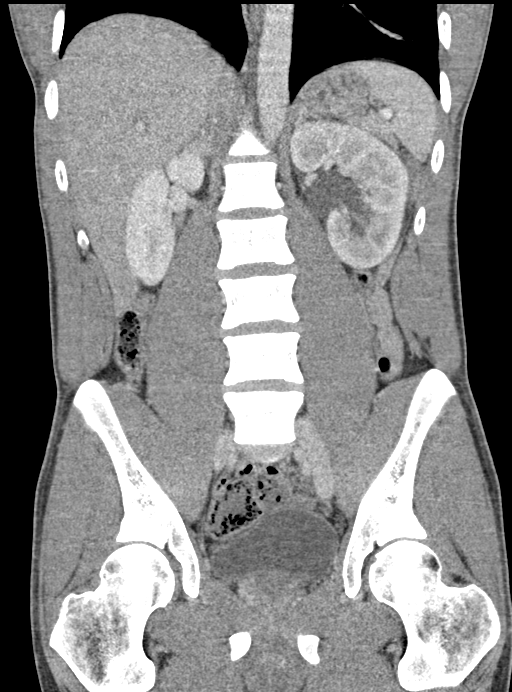

[16 of 46 positions shown; findings below may reference images not displayed]

FINDINGS: LOWER CHEST: There is no basilar pleural or apical pericardial
effusion.

HEPATOBILIARY: The hepatic contours and density are normal. There is
no intra- or extrahepatic biliary dilatation. The gallbladder is
normal.

PANCREAS: The pancreatic parenchymal contours are normal and there
is no ductal dilatation. There is no peripancreatic fluid
collection.

SPLEEN: Normal.

ADRENALS/URINARY TRACT:

--Adrenal glands: Normal.

--Right kidney/ureter: No hydronephrosis, nephroureterolithiasis,
perinephric stranding or solid renal mass.

--Left kidney/ureter: No hydronephrosis, nephroureterolithiasis,
perinephric stranding or solid renal mass.

--Urinary bladder: Normal for degree of distention

STOMACH/BOWEL:

--Stomach/Duodenum: There is no hiatal hernia or other gastric
abnormality. The duodenal course and caliber are normal.

--Small bowel: No dilatation or inflammation.

--Colon: No focal abnormality.

--Appendix: Normal.

VASCULAR/LYMPHATIC: Normal course and caliber of the major abdominal
vessels. No abdominal or pelvic lymphadenopathy.

REPRODUCTIVE: No free fluid in the pelvis.

MUSCULOSKELETAL. No bony spinal canal stenosis or focal osseous
abnormality.

OTHER: None.
IMPRESSION: No acute abdominal or pelvic abnormality.

## 2020-03-06 ENCOUNTER — Other Ambulatory Visit: Payer: Self-pay

## 2020-03-06 ENCOUNTER — Emergency Department (HOSPITAL_COMMUNITY)
Admission: EM | Admit: 2020-03-06 | Discharge: 2020-03-07 | Disposition: A | Discharge status: Home / Self Care | Payer: Self-pay | Attending: Emergency Medicine | Admitting: Emergency Medicine

## 2020-03-06 ENCOUNTER — Encounter (HOSPITAL_COMMUNITY): Payer: Self-pay | Admitting: Emergency Medicine

## 2020-03-06 DIAGNOSIS — R319 Hematuria, unspecified: Secondary | ICD-10-CM

## 2020-03-06 DIAGNOSIS — F17210 Nicotine dependence, cigarettes, uncomplicated: Secondary | ICD-10-CM | POA: Insufficient documentation

## 2020-03-06 DIAGNOSIS — N201 Calculus of ureter: Secondary | ICD-10-CM | POA: Insufficient documentation

## 2020-03-06 MED ORDER — ONDANSETRON HCL 4 MG/2ML IJ SOLN
4.0000 mg | Freq: Once | INTRAMUSCULAR | Status: AC
  Administered 2020-03-06: 4 mg via INTRAVENOUS
  Filled 2020-03-06: qty 2

## 2020-03-06 MED ORDER — KETOROLAC TROMETHAMINE 30 MG/ML IJ SOLN
30.0000 mg | Freq: Once | INTRAMUSCULAR | Status: AC
  Administered 2020-03-06: 30 mg via INTRAVENOUS
  Filled 2020-03-06: qty 1

## 2020-03-06 NOTE — ED Notes (Signed)
Attempted to obtain a temperature, pt refused stating "I feel like I have to throw up"

## 2020-03-06 NOTE — ED Triage Notes (Signed)
Pt reports he started having groin pain then vomiting about three hours ago.  It is difficult to get information b/c pt is in "too much pain."  Did state he is having pain w/ urination and drainage from his penis.  Pain is in his pelvis area not penis.

## 2020-03-06 NOTE — ED Provider Notes (Signed)
[facility] EMERGENCY DEPARTMENT Provider Note   CSN: [id number] Arrival date & time: 03/06/20  2322     History Chief Complaint  Patient presents with   Groin Pain    Keith Oneal is a 29 y.o. male.  The history is provided by the patient and medical records.  Groin Pain    29 year old male presenting to the ED with right lower abdominal/groin pain.  States it started a few hours ago.  Feels pain radiating into his back and is also had nausea and vomiting.  States he urinated on arrival to ED and this was grossly bloody.  States he does have some burning pain with urination.  He has no history of kidney stones in the past.  No meds prior to arrival.  History reviewed. No pertinent past medical history.  There are no problems to display for this patient.   History reviewed. No pertinent surgical history.     No family history on file.  Social History   Tobacco Use   Smoking status: Current Every Day Smoker    Packs/day: 0.00    Types: Cigarettes   Smokeless tobacco: Never Used  Vaping Use   Vaping Use: Never used  Substance Use Topics   Alcohol use: Yes   Drug use: No    Home Medications Prior to Admission medications   Medication Sig Start Date End Date Taking? Authorizing Provider  cephALEXin (KEFLEX) 500 MG capsule Take 1 capsule (500 mg total) by mouth 3 (three) times daily. Patient not taking: Reported on 01/01/2019 09/16/18   Ward, Layla Maw, DO  dicyclomine (BENTYL) 20 MG tablet Take 1 tablet (20 mg total) by mouth every 8 (eight) hours as needed for spasms (Abdominal cramping). 09/16/18   Ward, Layla Maw, DO  ibuprofen (ADVIL) 800 MG tablet Take 1 tablet (800 mg total) by mouth every 8 (eight) hours as needed for mild pain or moderate pain. 01/01/19   Mickie Bail, NP  ondansetron (ZOFRAN ODT) 4 MG disintegrating tablet Take 1 tablet (4 mg total) by mouth every 6 (six) hours as needed for nausea or vomiting. 09/16/18   Ward, Layla Maw, DO    Allergies    Patient has no known allergies.  Review of Systems   Review of Systems  Genitourinary: Positive for flank pain and hematuria.    Physical Exam Updated Vital Signs BP (!) 152/114 (BP Location: Right Arm)    Pulse 61    Resp 18    SpO2 100%   Physical Exam Vitals and nursing note reviewed.  Constitutional:      Appearance: He is well-developed.     Comments: Smells strongly of marijuana Curled up into fetal position  HENT:     Head: Normocephalic and atraumatic.  Eyes:     Conjunctiva/sclera: Conjunctivae normal.     Pupils: Pupils are equal, round, and reactive to light.  Cardiovascular:     Rate and Rhythm: Normal rate and regular rhythm.     Heart sounds: Normal heart sounds.  Pulmonary:     Effort: Pulmonary effort is normal.     Breath sounds: Normal breath sounds.  Abdominal:     General: Bowel sounds are normal.     Palpations: Abdomen is soft.     Tenderness: There is abdominal tenderness.    Musculoskeletal:        General: Normal range of motion.     Cervical back: Normal range of motion.  Skin:  General: Skin is warm and dry.  Neurological:     Mental Status: He is alert and oriented to person, place, and time.     ED Results / Procedures / Treatments   Labs (all labs ordered are listed, but only abnormal results are displayed) Labs Reviewed  CBC WITH DIFFERENTIAL/PLATELET - Abnormal; Notable for the following components:      Result Value   WBC 12.5 (*)    Neutro Abs 9.8 (*)    All other components within normal limits  BASIC METABOLIC PANEL - Abnormal; Notable for the following components:   Potassium 3.4 (*)    Glucose, Bld 136 (*)    All other components within normal limits  URINALYSIS, ROUTINE W REFLEX MICROSCOPIC - Abnormal; Notable for the following components:   Color, Urine AMBER (*)    APPearance CLOUDY (*)    Hgb urine dipstick LARGE (*)    Protein, ur 100 (*)    RBC / HPF >50 (*)    Bacteria, UA FEW (*)      All other components within normal limits  GC/CHLAMYDIA PROBE AMP ([place]) NOT AT [facility]    EKG None  Radiology CT Renal Stone Study  Result Date: 03/07/2020 CLINICAL DATA:  Flank pain. EXAM: CT ABDOMEN AND PELVIS WITHOUT CONTRAST TECHNIQUE: Multidetector CT imaging of the abdomen and pelvis was performed following the standard protocol without IV contrast. COMPARISON:  September 16, 2018 FINDINGS: Lower chest: The lung bases are clear. The heart size is normal. Hepatobiliary: The liver is normal. Normal gallbladder.There is no biliary ductal dilation. Pancreas: Normal contours without ductal dilatation. No peripancreatic fluid collection. Spleen: Unremarkable. Adrenals/Urinary Tract: --Adrenal glands: Unremarkable. --Right kidney/ureter: There is mild-to-moderate right-sided hydronephrosis that appears to be secondary to 2-3 mm stone in the distal right ureter --Left kidney/ureter: There are punctate nonobstructing stones in the lower pole the left kidney. --Urinary bladder: The bladder is decompressed and therefore poorly evaluated. Stomach/Bowel: --Stomach/Duodenum: No hiatal hernia or other gastric abnormality. Normal duodenal course and caliber. --Small bowel: Unremarkable. --Colon: Unremarkable. --Appendix: Normal. Vascular/Lymphatic: Normal course and caliber of the major abdominal vessels. --No retroperitoneal lymphadenopathy. --No mesenteric lymphadenopathy. --No pelvic or inguinal lymphadenopathy. Reproductive: Unremarkable Other: No ascites or free air. The abdominal wall is normal. Musculoskeletal. No acute displaced fractures. IMPRESSION: 1. Mild-to-moderate right-sided hydronephrosis secondary to a 2-3 mm stone in the distal right ureter. 2. Punctate nonobstructing stones in the lower pole the left kidney. Electronically Signed   By: Katherine Mantle M.D.   On: 03/07/2020 00:48    Procedures Procedures (including critical care time)  Medications Ordered in ED Medications  ketorolac  (TORADOL) 30 MG/ML injection 30 mg (30 mg Intravenous Given 03/06/20 2357)  ondansetron (ZOFRAN) injection 4 mg (4 mg Intravenous Given 03/06/20 2358)    ED Course  I have reviewed the triage vital signs and the nursing notes.  Pertinent labs & imaging results that were available during my care of the patient were reviewed by me and considered in my medical decision making (see chart for details).    MDM Rules/Calculators/A&P  29 year old male here with right lower abdominal/groin pain. Began having hematuria on arrival to ED. On exam he smells heavily of marijuana but is in no acute distress and vitals are stable. Labs obtained, no significant abnormalities. UA with large blood.  Gc/chl sent.  Renal stone study with 2-27mm stone distal right ureter.  Patient sleeping soundly after IV toradol and zofran here.  No active emesis in ED.  Stable for discharge, encouraged good water intake, Rx percocet, zofran, flomax.  Urology follow-up given.  Return here for any new/acute changes.  Final Clinical Impression(s) / ED Diagnoses Final diagnoses:  Right ureteral stone  Hematuria, unspecified type    Rx / DC Orders ED Discharge Orders         Ordered    oxyCODONE-acetaminophen (PERCOCET) 5-325 MG tablet  Every 4 hours PRN        03/07/20 0122    tamsulosin (FLOMAX) 0.4 MG CAPS capsule  Daily after supper        03/07/20 0122    ondansetron (ZOFRAN ODT) 4 MG disintegrating tablet  Every 8 hours PRN        03/07/20 0122           Garlon Hatchet, PA-C 03/07/20 0140    Gilda Crease, MD 03/07/20 [phone]

## 2020-03-07 ENCOUNTER — Emergency Department (HOSPITAL_COMMUNITY): Payer: Self-pay

## 2020-03-07 LAB — BASIC METABOLIC PANEL
Anion gap: 11 (ref 5–15)
BUN: 8 mg/dL (ref 6–20)
CO2: 26 mmol/L (ref 22–32)
Calcium: 9.6 mg/dL (ref 8.9–10.3)
Chloride: 102 mmol/L (ref 98–111)
Creatinine, Ser: 1.22 mg/dL (ref 0.61–1.24)
GFR, Estimated: >60 mL/min (ref >60)
Glucose, Bld: 136 mg/dL — ABNORMAL HIGH (ref 70–99)
Potassium: 3.4 mmol/L — ABNORMAL LOW (ref 3.5–5.1)
Sodium: 139 mmol/L (ref 135–145)

## 2020-03-07 LAB — GC/CHLAMYDIA PROBE AMP (~~LOC~~) NOT AT ARMC
Chlamydia: NEGATIVE
Comment: NEGATIVE
Comment: NORMAL
Neisseria Gonorrhea: NEGATIVE

## 2020-03-07 LAB — CBC WITH DIFFERENTIAL/PLATELET
Abs Immature Granulocytes: 0.05 10*3/uL (ref 0.00–0.07)
Basophils Absolute: 0 10*3/uL (ref 0.0–0.1)
Basophils Relative: 0 %
Eosinophils Absolute: 0 10*3/uL (ref 0.0–0.5)
Eosinophils Relative: 0 %
HCT: 44.8 % (ref 39.0–52.0)
Hemoglobin: 15.1 g/dL (ref 13.0–17.0)
Immature Granulocytes: 0 %
Lymphocytes Relative: 16 %
Lymphs Abs: 2 10*3/uL (ref 0.7–4.0)
MCH: 30.8 pg (ref 26.0–34.0)
MCHC: 33.7 g/dL (ref 30.0–36.0)
MCV: 91.4 fL (ref 80.0–100.0)
Monocytes Absolute: 0.7 10*3/uL (ref 0.1–1.0)
Monocytes Relative: 5 %
Neutro Abs: 9.8 10*3/uL — ABNORMAL HIGH (ref 1.7–7.7)
Neutrophils Relative %: 79 %
Platelets: 261 10*3/uL (ref 150–400)
RBC: 4.9 MIL/uL (ref 4.22–5.81)
RDW: 11.8 % (ref 11.5–15.5)
WBC: 12.5 10*3/uL — ABNORMAL HIGH (ref 4.0–10.5)
nRBC: 0 % (ref 0.0–0.2)

## 2020-03-07 LAB — URINALYSIS, ROUTINE W REFLEX MICROSCOPIC
Bilirubin Urine: NEGATIVE
Glucose, UA: NEGATIVE mg/dL
Ketones, ur: NEGATIVE mg/dL
Leukocytes,Ua: NEGATIVE
Nitrite: NEGATIVE
Protein, ur: 100 mg/dL — AB
RBC / HPF: >50 RBC/hpf — ABNORMAL HIGH (ref 0–5)
Specific Gravity, Urine: 1.018 (ref 1.005–1.030)
pH: 6 (ref 5.0–8.0)

## 2020-03-07 MED ORDER — ONDANSETRON 4 MG PO TBDP: 4.0000 mg | ORAL_TABLET | Freq: Three times a day (TID) | ORAL | 0 refills | Status: AC | PRN

## 2020-03-07 MED ORDER — OXYCODONE-ACETAMINOPHEN 5-325 MG PO TABS: 1.0000 | ORAL_TABLET | ORAL | 0 refills | Status: AC | PRN

## 2020-03-07 MED ORDER — TAMSULOSIN HCL 0.4 MG PO CAPS: 0.4000 mg | ORAL_CAPSULE | Freq: Every day | ORAL | 0 refills | Status: AC

## 2020-03-07 NOTE — Discharge Instructions (Signed)
Take the prescribed medication as directed.  Make sure to drink lots of water to help it pass. Follow-up with urology if any ongoing issues. Return to the ED for new or worsening symptoms.

## 2021-05-18 IMAGING — CT CT RENAL STONE PROTOCOL
2 of 4 series · 15 of 46 positions shown, 17 images · non-contrast
Comparison: September 16, 2018

CLINICAL DATA: Flank pain.

EXAM:
CT ABDOMEN AND PELVIS WITHOUT CONTRAST
TECHNIQUE: Multidetector CT imaging of the abdomen and pelvis was performed
following the standard protocol without IV contrast.

[Series 3: ap without · axial · non-contrast · 0.55mm/px · z∈[-744,-354]mm · 12 of 88 slices shown, 14 images]
[im 5/88  soft-tissue]
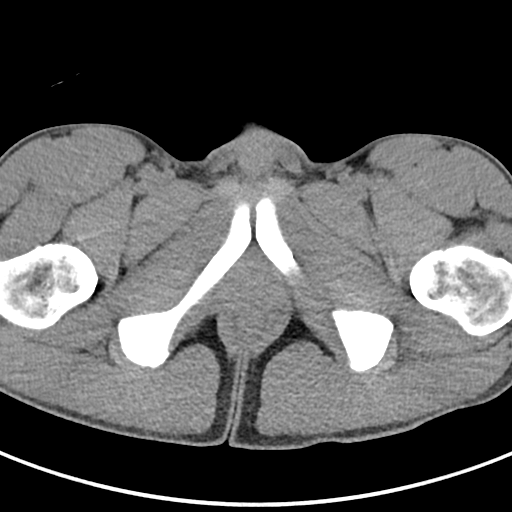
[im 5/88  bone]
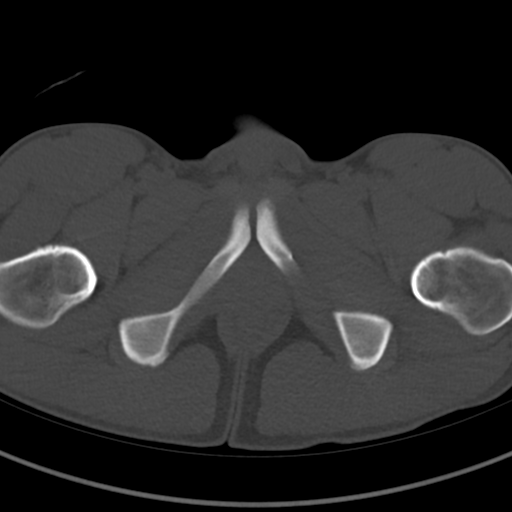
[im 14/88  soft-tissue]
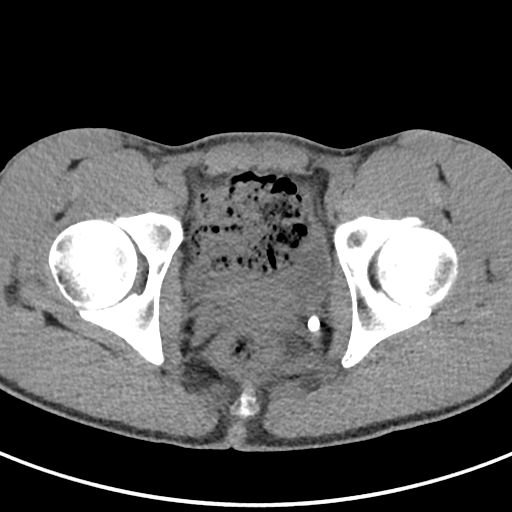
[im 18/88  soft-tissue]
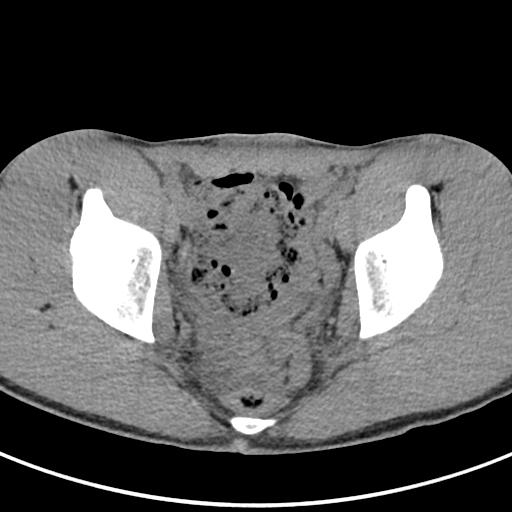
[im 27/88  soft-tissue]
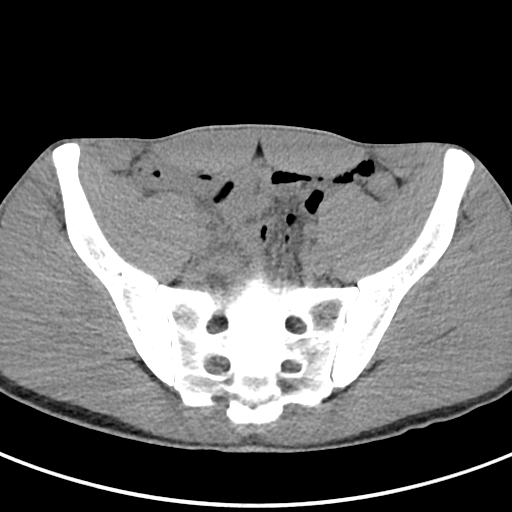
[im 35/88  soft-tissue]
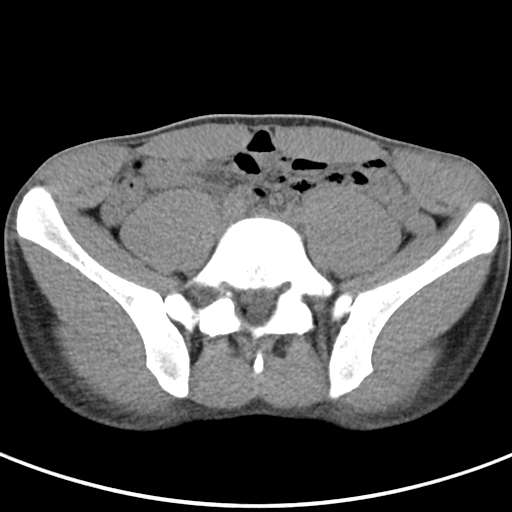
[im 40/88  soft-tissue]
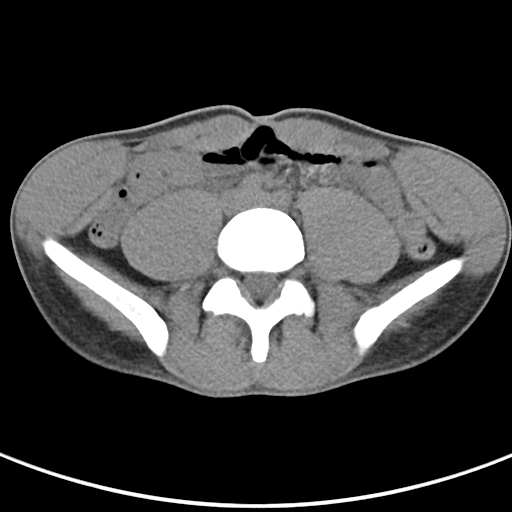
[im 48/88  soft-tissue]
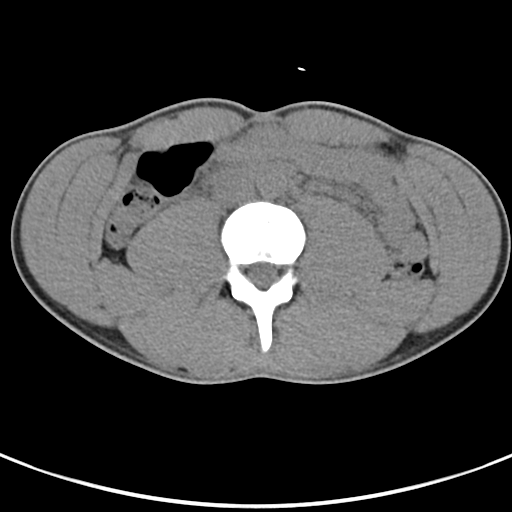
[im 53/88  soft-tissue]
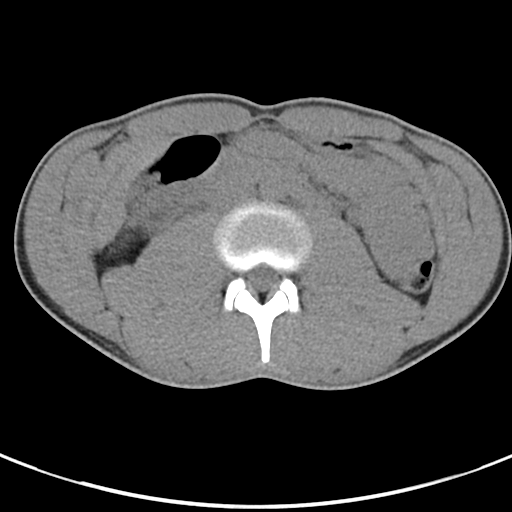
[im 61/88  soft-tissue]
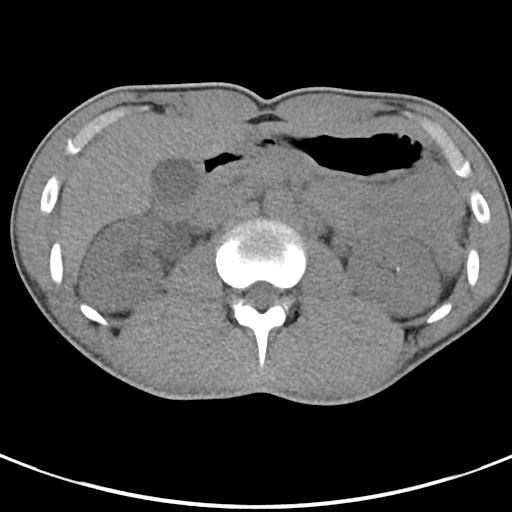
[im 61/88  bone]
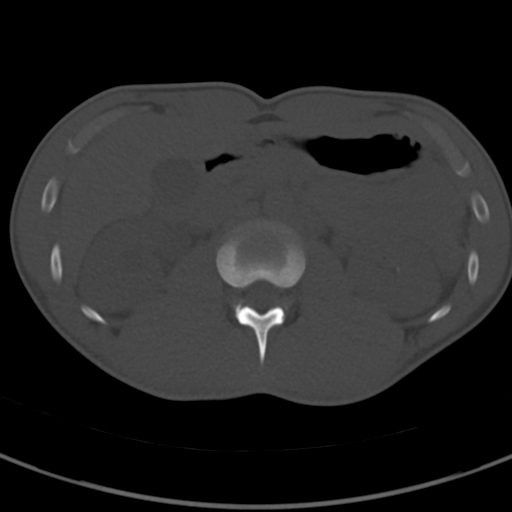
[im 70/88  soft-tissue]
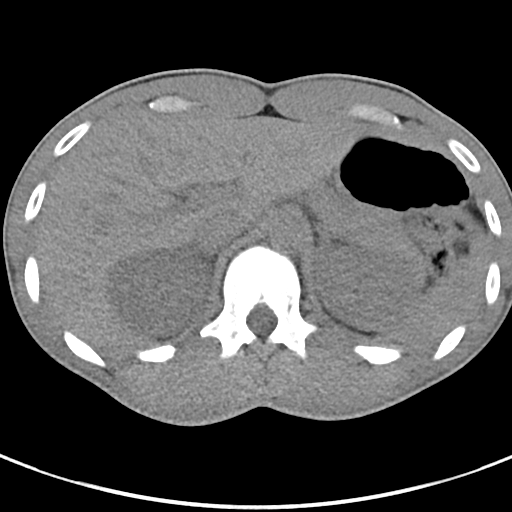
[im 74/88  soft-tissue]
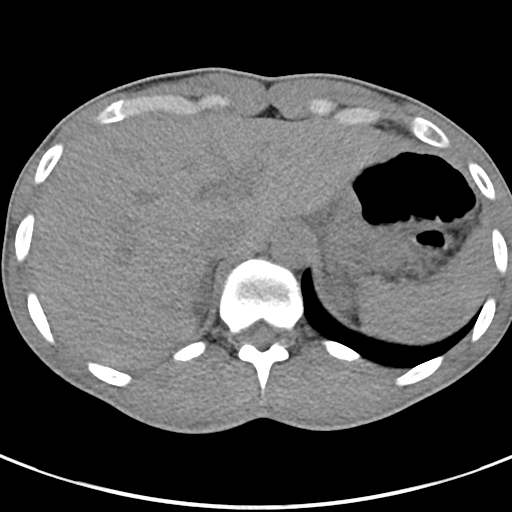
[im 83/88  soft-tissue]
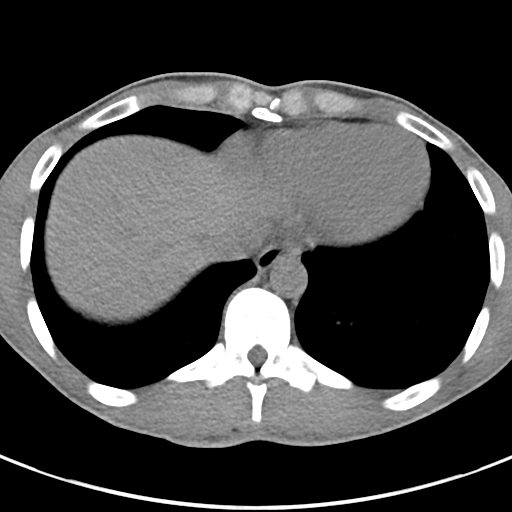

[Series 5: cor · coronal · 0.56mm/px · 3 of 63 slices shown]
[im 21/63  soft-tissue]
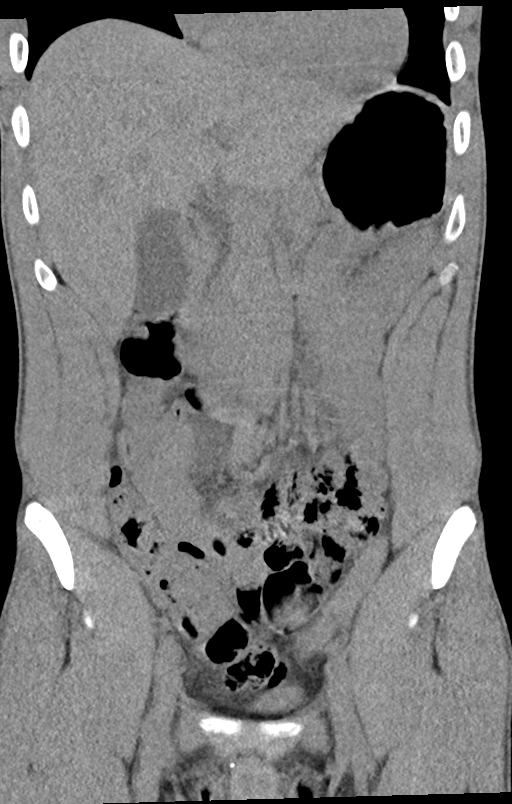
[im 28/63  soft-tissue]
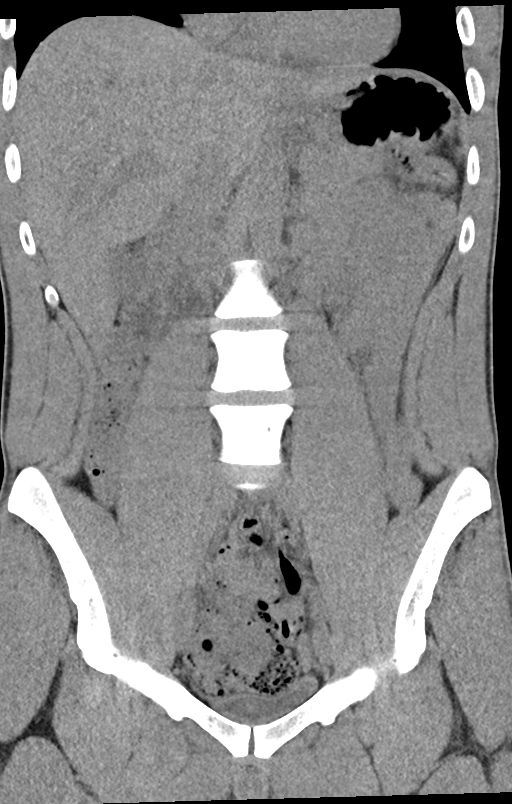
[im 35/63  soft-tissue]
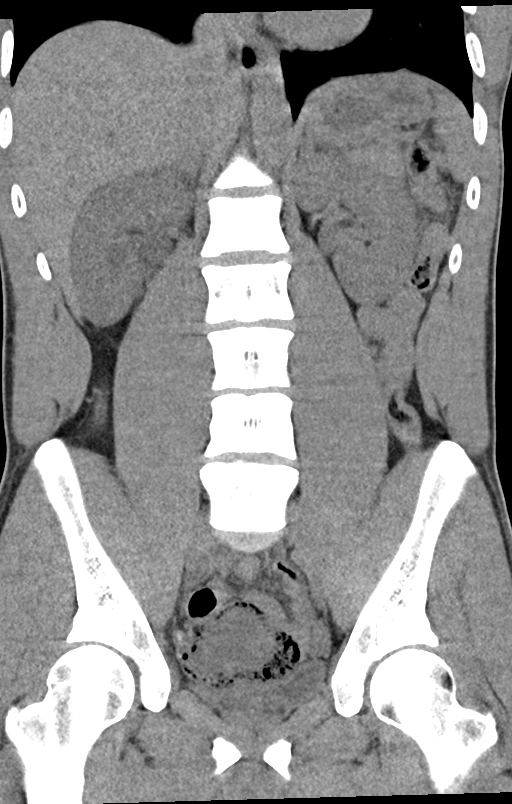

[15 of 46 positions shown; findings below may reference images not displayed]

FINDINGS: Lower chest: The lung bases are clear. The heart size is normal.

Hepatobiliary: The liver is normal. Normal gallbladder.There is no
biliary ductal dilation.

Pancreas: Normal contours without ductal dilatation. No
peripancreatic fluid collection.

Spleen: Unremarkable.

Adrenals/Urinary Tract:

--Adrenal glands: Unremarkable.

--Right kidney/ureter: There is mild-to-moderate right-sided
hydronephrosis that appears to be secondary to 2-3 mm stone in the
distal right ureter

--Left kidney/ureter: There are punctate nonobstructing stones in
the lower pole the left kidney.

--Urinary bladder: The bladder is decompressed and therefore poorly
evaluated.

Stomach/Bowel:

--Stomach/Duodenum: No hiatal hernia or other gastric abnormality.
Normal duodenal course and caliber.

--Small bowel: Unremarkable.

--Colon: Unremarkable.

--Appendix: Normal.

Vascular/Lymphatic: Normal course and caliber of the major abdominal
vessels.

--No retroperitoneal lymphadenopathy.

--No mesenteric lymphadenopathy.

--No pelvic or inguinal lymphadenopathy.

Reproductive: Unremarkable

Other: No ascites or free air. The abdominal wall is normal.

Musculoskeletal. No acute displaced fractures.
IMPRESSION: 1. Mild-to-moderate right-sided hydronephrosis secondary to a 2-3 mm
stone in the distal right ureter.
2. Punctate nonobstructing stones in the lower pole the left kidney.

## 2022-05-05 ENCOUNTER — Ambulatory Visit (HOSPITAL_COMMUNITY): Payer: Self-pay

## 2024-05-18 ENCOUNTER — Ambulatory Visit (HOSPITAL_COMMUNITY)
Admission: EM | Admit: 2024-05-18 | Discharge: 2024-05-18 | Disposition: A | Discharge status: Home / Self Care | Payer: Self-pay | Source: Home / Self Care

## 2024-05-18 ENCOUNTER — Encounter (HOSPITAL_COMMUNITY): Payer: Self-pay

## 2024-05-18 DIAGNOSIS — K148 Other diseases of tongue: Secondary | ICD-10-CM

## 2024-05-18 DIAGNOSIS — Z113 Encounter for screening for infections with a predominantly sexual mode of transmission: Secondary | ICD-10-CM

## 2024-05-18 DIAGNOSIS — K0889 Other specified disorders of teeth and supporting structures: Secondary | ICD-10-CM

## 2024-05-18 LAB — POCT URINE DIPSTICK
Bilirubin, UA: NEGATIVE
Glucose, UA: NEGATIVE mg/dL
Ketones, POC UA: NEGATIVE mg/dL
Leukocytes, UA: NEGATIVE
Nitrite, UA: NEGATIVE
POC PROTEIN,UA: NEGATIVE
Spec Grav, UA: 1.015
Urobilinogen, UA: 1 U/dL
pH, UA: 7

## 2024-05-18 MED ORDER — DICLOFENAC SODIUM 75 MG PO TBEC: 75.0000 mg | DELAYED_RELEASE_TABLET | Freq: Two times a day (BID) | ORAL | 0 refills | Status: AC

## 2024-05-18 MED ORDER — CHLORHEXIDINE GLUCONATE 0.12 % MT SOLN: 15.0000 mL | Freq: Two times a day (BID) | OROMUCOSAL | 0 refills | Status: AC

## 2024-05-18 NOTE — Discharge Instructions (Addendum)
 Follow up with dentist as soon as possible Follow up with ENT for evaluation of tongue lesion Follow up with PCP  We will contact you with lab results when symptoms become available typically return in 5 days.

## 2024-05-18 NOTE — ED Triage Notes (Signed)
 Patient presenting bilateral back molar pain. Onset off and on for a few weeks. Pain going up into the ears. States he does have some broken teeth.  Patient having urinary frequency and urgency. Denies any painful urination. States his spouse having similar symptoms and a UTI.

## 2024-05-18 NOTE — ED Provider Notes (Signed)
 " [facility]    CSN: [id number] Arrival date & time: 05/18/24  1118      History   Chief Complaint Chief Complaint  Patient presents with   Dental Pain   Urinary Frequency    HPI Keith Oneal is a 34 y.o. male.   Patient here with multiple concerns.  First concern he reports his girlfriend has had multiple UTIs and would like him to get tested for urinary tract infections.  He denies any exposure to STDs.  He denies any symptoms, states he feels with respect to urinary symptoms.  Also current with concern with bilateral lower molar pain times months.  He denies swelling of face or jaw, gingival swelling, fever, chills, nausea, vomiting, difficulty breathing, difficulty swallowing.  He does not have a dentist.  He is also concerned with a white spot on his tongue.  Showed up about 2 weeks ago.  Located on the right side of his tongue.  He may have bit it with his teeth but was not really sure.  He does smoke and vape.    History reviewed. No pertinent past medical history.  There are no active problems to display for this patient.   History reviewed. No pertinent surgical history.     Home Medications    Prior to Admission medications  Medication Sig Start Date End Date Taking? Authorizing Provider  chlorhexidine  (PERIDEX ) 0.12 % solution Use as directed 15 mLs in the mouth or throat 2 (two) times daily. 05/18/24  Yes Juleen Rush, PA-C  diclofenac  (VOLTAREN ) 75 MG EC tablet Take 1 tablet (75 mg total) by mouth 2 (two) times daily. 05/18/24  Yes Juleen Rush, PA-C  cephALEXin  (KEFLEX ) 500 MG capsule Take 1 capsule (500 mg total) by mouth 3 (three) times daily. Patient not taking: Reported on 01/01/2019 09/16/18   Ward, Josette SAILOR, DO  dicyclomine  (BENTYL ) 20 MG tablet Take 1 tablet (20 mg total) by mouth every 8 (eight) hours as needed for spasms (Abdominal cramping). 09/16/18   Ward, Josette SAILOR, DO  ibuprofen  (ADVIL ) 800 MG tablet Take 1 tablet (800 mg total) by  mouth every 8 (eight) hours as needed for mild pain or moderate pain. 01/01/19   Corlis Burnard DEL, NP  ondansetron  (ZOFRAN  ODT) 4 MG disintegrating tablet Take 1 tablet (4 mg total) by mouth every 8 (eight) hours as needed for nausea. 03/07/20   Jarold Olam HERO, PA-C  oxyCODONE -acetaminophen  (PERCOCET) 5-325 MG tablet Take 1 tablet by mouth every 4 (four) hours as needed. 03/07/20   Jarold Olam HERO, PA-C  tamsulosin  (FLOMAX ) 0.4 MG CAPS capsule Take 1 capsule (0.4 mg total) by mouth daily after supper. 03/07/20   Jarold Olam HERO, PA-C    Family History History reviewed. No pertinent family history.  Social History Social History[1]   Allergies   Patient has no known allergies.   Review of Systems Review of Systems  Constitutional:  Negative for chills, fatigue, fever and unexpected weight change.  HENT:  Positive for dental problem and mouth sores. Negative for congestion, drooling, hearing loss, nosebleeds, postnasal drip, rhinorrhea, sore throat, tinnitus and trouble swallowing.   Gastrointestinal:  Negative for abdominal pain, nausea and vomiting.  Genitourinary:  Negative for decreased urine volume, difficulty urinating, dysuria, flank pain, frequency, genital sores, hematuria, penile discharge, penile pain, scrotal swelling, testicular pain and urgency.  Musculoskeletal:  Negative for back pain.  Skin:  Negative for rash and wound.  Allergic/Immunologic: Negative for environmental allergies and immunocompromised state.  Neurological:  Negative for headaches.  Hematological:  Negative for adenopathy. Does not bruise/bleed easily.  Psychiatric/Behavioral:  Negative for sleep disturbance.      Physical Exam Triage Vital Signs ED Triage Vitals  Encounter Vitals Group     BP 05/18/24 1140 (!) 156/91     Girls Systolic BP Percentile --      Girls Diastolic BP Percentile --      Boys Systolic BP Percentile --      Boys Diastolic BP Percentile --      Pulse Rate 05/18/24 1140 69      Resp 05/18/24 1140 18     Temp 05/18/24 1140 98.2 F (36.8 C)     Temp Source 05/18/24 1140 Oral     SpO2 05/18/24 1140 95 %     Weight --      Height --      Head Circumference --      Peak Flow --      Pain Score 05/18/24 1139 10     Pain Loc --      Pain Education --      Exclude from Growth Chart --    No data found.  Updated Vital Signs BP (!) 156/91 (BP Location: Left Wrist)   Pulse 69   Temp 98.2 F (36.8 C) (Oral)   Resp 18   SpO2 95%   Visual Acuity Right Eye Distance:   Left Eye Distance:   Bilateral Distance:    Right Eye Near:   Left Eye Near:    Bilateral Near:     Physical Exam Vitals and nursing note reviewed.  Constitutional:      General: He is not in acute distress.    Appearance: Normal appearance. He is not ill-appearing.  HENT:     Head: Normocephalic and atraumatic.     Mouth/Throat:     Mouth: No oral lesions.     Dentition: Abnormal dentition (Multiple cracked teeth). Does not have dentures. Dental caries present. No dental tenderness, gingival swelling, dental abscesses or gum lesions.     Tongue: Lesions (6 mm white lesion right lateral aspect of tongue) present. Tongue does not deviate from midline.  Eyes:     General: No scleral icterus.    Extraocular Movements: Extraocular movements intact.     Conjunctiva/sclera: Conjunctivae normal.  Pulmonary:     Effort: Pulmonary effort is normal. No respiratory distress.  Abdominal:     General: There is no distension.     Tenderness: There is no abdominal tenderness. There is no right CVA tenderness, left CVA tenderness, guarding or rebound.  Musculoskeletal:        General: Normal range of motion.     Cervical back: Normal range of motion. No rigidity.  Skin:    General: Skin is warm.     Coloration: Skin is not jaundiced.     Findings: No rash.  Neurological:     General: No focal deficit present.     Mental Status: He is alert and oriented to person, place, and time.     Motor: No  weakness.     Gait: Gait normal.  Psychiatric:        Mood and Affect: Mood normal.        Behavior: Behavior normal.      UC Treatments / Results  Labs (all labs ordered are listed, but only abnormal results are displayed) Labs Reviewed  POCT URINE DIPSTICK - Abnormal; Notable for the following components:  Result Value   Blood, UA trace-intact (*)    All other components within normal limits  CYTOLOGY, (ORAL, ANAL, URETHRAL) ANCILLARY ONLY    EKG   Radiology No results found.  Procedures Procedures (including critical care time)  Medications Ordered in UC Medications - No data to display  Initial Impression / Assessment and Plan / UC Course  I have reviewed the triage vital signs and the nursing notes.  Pertinent labs & imaging results that were available during my care of the patient were reviewed by me and considered in my medical decision making (see chart for details).     Follow-up with primary care, dentist, ENT. Small spot on tongue could be abrasion, discussed other possibilities including leukoplakia Patient expressed understanding.  Had long conversation with the patient that while women are prone to UTIs due to intercourse is no infection that he can give to her.  Discussed possibly of STDs, he would like to be tested though he has not been exposed is not concerned about it. Advised him that antibiotic treatment for him is not required at this time pending results from STD testing. Final Clinical Impressions(s) / UC Diagnoses   Final diagnoses:  Tongue lesion  Pain, dental  Screen for STD (sexually transmitted disease)     Discharge Instructions      Follow up with dentist as soon as possible Follow up with ENT for evaluation of tongue lesion Follow up with PCP  We will contact you with lab results when symptoms become available typically return in 5 days.    ED Prescriptions     Medication Sig Dispense Auth. Provider   diclofenac   (VOLTAREN ) 75 MG EC tablet Take 1 tablet (75 mg total) by mouth 2 (two) times daily. 20 tablet Juleen Rush, PA-C   chlorhexidine  (PERIDEX ) 0.12 % solution Use as directed 15 mLs in the mouth or throat 2 (two) times daily. 120 mL Juleen Rush, PA-C      PDMP not reviewed this encounter.    [1]  Social History Tobacco Use   Smoking status: Every Day    Types: Cigarettes   Smokeless tobacco: Never  Vaping Use   Vaping status: Never Used  Substance Use Topics   Alcohol use: Yes   Drug use: No     Juleen Rush, PA-C 05/18/24 1245  "
